# Patient Record
Sex: Female | Born: 1960
Health system: Southern US, Community
[De-identification: ages and names within clinical notes are randomized; demographics above are authoritative.]

## PROBLEM LIST (undated history)

## (undated) DIAGNOSIS — Z98811 Dental restoration status: Secondary | ICD-10-CM

## (undated) DIAGNOSIS — G5602 Carpal tunnel syndrome, left upper limb: Secondary | ICD-10-CM

## (undated) DIAGNOSIS — Q639 Congenital malformation of kidney, unspecified: Secondary | ICD-10-CM

## (undated) DIAGNOSIS — E039 Hypothyroidism, unspecified: Secondary | ICD-10-CM

## (undated) DIAGNOSIS — Z972 Presence of dental prosthetic device (complete) (partial): Secondary | ICD-10-CM

## (undated) DIAGNOSIS — M654 Radial styloid tenosynovitis [de Quervain]: Secondary | ICD-10-CM

## (undated) HISTORY — PX: KNEE ARTHROSCOPY: SHX127

---

## 1996-04-05 HISTORY — PX: PILONIDAL CYST / SINUS EXCISION: SUR543

## 2006-04-05 HISTORY — PX: WISDOM TOOTH EXTRACTION: SHX21

## 2009-07-04 ENCOUNTER — Encounter: Admission: RE | Admit: 2009-07-04 | Discharge: 2009-07-04 | Payer: Self-pay | Admitting: Gastroenterology

## 2009-07-14 ENCOUNTER — Encounter: Admission: RE | Admit: 2009-07-14 | Discharge: 2009-07-14 | Payer: Self-pay | Admitting: Gastroenterology

## 2009-07-21 ENCOUNTER — Encounter: Admission: RE | Admit: 2009-07-21 | Discharge: 2009-07-21 | Payer: Self-pay | Admitting: Gastroenterology

## 2009-08-27 ENCOUNTER — Encounter (HOSPITAL_COMMUNITY): Admission: RE | Admit: 2009-08-27 | Discharge: 2009-11-25 | Payer: Self-pay | Admitting: Internal Medicine

## 2009-09-09 ENCOUNTER — Encounter: Admission: RE | Admit: 2009-09-09 | Discharge: 2009-09-09 | Payer: Self-pay | Admitting: Internal Medicine

## 2009-09-09 ENCOUNTER — Other Ambulatory Visit: Admission: RE | Admit: 2009-09-09 | Discharge: 2009-09-09 | Payer: Self-pay | Admitting: Interventional Radiology

## 2009-10-24 ENCOUNTER — Encounter (HOSPITAL_COMMUNITY): Admission: RE | Admit: 2009-10-24 | Discharge: 2009-12-25 | Payer: Self-pay | Admitting: Internal Medicine

## 2010-01-20 ENCOUNTER — Encounter: Admission: RE | Admit: 2010-01-20 | Discharge: 2010-01-20 | Payer: Self-pay | Admitting: Gastroenterology

## 2010-04-25 ENCOUNTER — Other Ambulatory Visit: Payer: Self-pay | Admitting: Gastroenterology

## 2010-04-25 DIAGNOSIS — R911 Solitary pulmonary nodule: Secondary | ICD-10-CM

## 2010-11-15 ENCOUNTER — Emergency Department (HOSPITAL_COMMUNITY)
Admission: EM | Admit: 2010-11-15 | Discharge: 2010-11-16 | Disposition: A | Discharge status: Home / Self Care | Payer: Federal, State, Local not specified - PPO | Attending: Emergency Medicine | Admitting: Emergency Medicine

## 2010-11-15 DIAGNOSIS — R110 Nausea: Secondary | ICD-10-CM | POA: Insufficient documentation

## 2010-11-15 DIAGNOSIS — R109 Unspecified abdominal pain: Secondary | ICD-10-CM | POA: Insufficient documentation

## 2010-11-15 DIAGNOSIS — K7689 Other specified diseases of liver: Secondary | ICD-10-CM | POA: Insufficient documentation

## 2010-11-15 DIAGNOSIS — R319 Hematuria, unspecified: Secondary | ICD-10-CM | POA: Insufficient documentation

## 2010-11-15 DIAGNOSIS — D1809 Hemangioma of other sites: Secondary | ICD-10-CM | POA: Insufficient documentation

## 2010-11-15 DIAGNOSIS — E039 Hypothyroidism, unspecified: Secondary | ICD-10-CM | POA: Insufficient documentation

## 2010-11-15 DIAGNOSIS — R1013 Epigastric pain: Secondary | ICD-10-CM | POA: Insufficient documentation

## 2010-11-15 LAB — DIFFERENTIAL
Basophils Absolute: 0 K/uL (ref 0.0–0.1)
Basophils Relative: 0 % (ref 0–1)
Eosinophils Absolute: 0.1 10*3/uL (ref 0.0–0.7)
Eosinophils Relative: 2 % (ref 0–5)
Lymphocytes Relative: 40 % (ref 12–46)
Lymphs Abs: 2.8 K/uL (ref 0.7–4.0)
Monocytes Absolute: 0.4 10*3/uL (ref 0.1–1.0)
Monocytes Relative: 6 % (ref 3–12)
Neutro Abs: 3.7 K/uL (ref 1.7–7.7)
Neutrophils Relative %: 52 % (ref 43–77)

## 2010-11-15 LAB — COMPREHENSIVE METABOLIC PANEL WITH GFR
ALT: 13 U/L (ref 0–35)
AST: 17 U/L (ref 0–37)
Albumin: 3.4 g/dL — ABNORMAL LOW (ref 3.5–5.2)
BUN: 10 mg/dL (ref 6–23)
CO2: 26 meq/L (ref 19–32)
Creatinine, Ser: 0.81 mg/dL (ref 0.50–1.10)
GFR calc Af Amer: >60 mL/min (ref >60)
Glucose, Bld: 104 mg/dL — ABNORMAL HIGH (ref 70–99)
Potassium: 4.1 meq/L (ref 3.5–5.1)
Sodium: 140 meq/L (ref 135–145)
Total Bilirubin: 0.1 mg/dL — ABNORMAL LOW (ref 0.3–1.2)

## 2010-11-15 LAB — COMPREHENSIVE METABOLIC PANEL
Alkaline Phosphatase: 48 U/L (ref 39–117)
Calcium: 9.4 mg/dL (ref 8.4–10.5)
Chloride: 104 mEq/L (ref 96–112)
GFR calc non Af Amer: >60 mL/min (ref >60)
Total Protein: 7.6 g/dL (ref 6.0–8.3)

## 2010-11-15 LAB — CBC
HCT: 33.8 % — ABNORMAL LOW (ref 36.0–46.0)
Hemoglobin: 11.2 g/dL — ABNORMAL LOW (ref 12.0–15.0)
MCH: 28.6 pg (ref 26.0–34.0)
MCHC: 33.1 g/dL (ref 30.0–36.0)
MCV: 86.2 fL (ref 78.0–100.0)
Platelets: 311 K/uL (ref 150–400)
RBC: 3.92 MIL/uL (ref 3.87–5.11)
RDW: 14.3 % (ref 11.5–15.5)
WBC: 7 10*3/uL (ref 4.0–10.5)

## 2010-11-15 LAB — URINALYSIS, ROUTINE W REFLEX MICROSCOPIC
Bilirubin Urine: NEGATIVE
Glucose, UA: NEGATIVE mg/dL
Nitrite: NEGATIVE
Specific Gravity, Urine: 1.009 (ref 1.005–1.030)
Urobilinogen, UA: 0.2 mg/dL (ref 0.0–1.0)

## 2010-11-15 LAB — POCT PREGNANCY, URINE: Preg Test, Ur: NEGATIVE

## 2010-11-15 LAB — URINE MICROSCOPIC-ADD ON

## 2010-11-15 LAB — LIPASE, BLOOD: Lipase: 49 U/L (ref 11–59)

## 2010-11-16 ENCOUNTER — Emergency Department (HOSPITAL_COMMUNITY): Payer: Federal, State, Local not specified - PPO

## 2010-12-01 ENCOUNTER — Other Ambulatory Visit: Payer: Self-pay | Admitting: Gastroenterology

## 2011-01-22 ENCOUNTER — Ambulatory Visit
Admission: RE | Admit: 2011-01-22 | Discharge: 2011-01-22 | Disposition: A | Discharge status: Home / Self Care | Payer: Federal, State, Local not specified - PPO | Source: Ambulatory Visit | Attending: Gastroenterology | Admitting: Gastroenterology

## 2011-01-22 DIAGNOSIS — R911 Solitary pulmonary nodule: Secondary | ICD-10-CM

## 2011-02-11 ENCOUNTER — Other Ambulatory Visit: Payer: Self-pay | Admitting: Internal Medicine

## 2011-02-11 DIAGNOSIS — E042 Nontoxic multinodular goiter: Secondary | ICD-10-CM

## 2011-02-15 ENCOUNTER — Ambulatory Visit
Admission: RE | Admit: 2011-02-15 | Discharge: 2011-02-15 | Disposition: A | Discharge status: Home / Self Care | Payer: Federal, State, Local not specified - PPO | Source: Ambulatory Visit | Attending: Internal Medicine | Admitting: Internal Medicine

## 2011-02-15 DIAGNOSIS — E042 Nontoxic multinodular goiter: Secondary | ICD-10-CM

## 2011-05-07 ENCOUNTER — Emergency Department (HOSPITAL_COMMUNITY): Payer: Federal, State, Local not specified - PPO

## 2011-05-07 ENCOUNTER — Emergency Department (HOSPITAL_COMMUNITY)
Admission: EM | Admit: 2011-05-07 | Discharge: 2011-05-07 | Disposition: A | Discharge status: Home / Self Care | Payer: Federal, State, Local not specified - PPO | Attending: Emergency Medicine | Admitting: Emergency Medicine

## 2011-05-07 ENCOUNTER — Encounter (HOSPITAL_COMMUNITY): Payer: Self-pay | Admitting: Emergency Medicine

## 2011-05-07 ENCOUNTER — Other Ambulatory Visit: Payer: Self-pay

## 2011-05-07 DIAGNOSIS — J029 Acute pharyngitis, unspecified: Secondary | ICD-10-CM | POA: Insufficient documentation

## 2011-05-07 DIAGNOSIS — R1013 Epigastric pain: Secondary | ICD-10-CM

## 2011-05-07 DIAGNOSIS — IMO0001 Reserved for inherently not codable concepts without codable children: Secondary | ICD-10-CM | POA: Insufficient documentation

## 2011-05-07 DIAGNOSIS — J3489 Other specified disorders of nose and nasal sinuses: Secondary | ICD-10-CM | POA: Insufficient documentation

## 2011-05-07 LAB — COMPREHENSIVE METABOLIC PANEL
ALT: 10 U/L (ref 0–35)
Albumin: 3.4 g/dL — ABNORMAL LOW (ref 3.5–5.2)
CO2: 18 mEq/L — ABNORMAL LOW (ref 19–32)
Calcium: 9.4 mg/dL (ref 8.4–10.5)
Chloride: 102 mEq/L (ref 96–112)
GFR calc Af Amer: >90 mL/min (ref >90)
Glucose, Bld: 90 mg/dL (ref 70–99)
Total Protein: 7.9 g/dL (ref 6.0–8.3)

## 2011-05-07 LAB — DIFFERENTIAL
Basophils Relative: 0 % (ref 0–1)
Eosinophils Absolute: 0.2 10*3/uL (ref 0.0–0.7)
Monocytes Absolute: 0.6 10*3/uL (ref 0.1–1.0)
Monocytes Relative: 4 % (ref 3–12)
Neutro Abs: 10.6 10*3/uL — ABNORMAL HIGH (ref 1.7–7.7)

## 2011-05-07 LAB — CBC
HCT: 33.4 % — ABNORMAL LOW (ref 36.0–46.0)
MCV: 84.3 fL (ref 78.0–100.0)
Platelets: 287 10*3/uL (ref 150–400)
RBC: 3.96 MIL/uL (ref 3.87–5.11)
RDW: 13.8 % (ref 11.5–15.5)
WBC: 13.3 10*3/uL — ABNORMAL HIGH (ref 4.0–10.5)

## 2011-05-07 MED ORDER — ONDANSETRON HCL 4 MG PO TABS: 4.0000 mg | ORAL_TABLET | Freq: Four times a day (QID) | ORAL | Status: AC | PRN

## 2011-05-07 MED ORDER — HYDROMORPHONE HCL PF 1 MG/ML IJ SOLN
1.0000 mg | Freq: Once | INTRAMUSCULAR | Status: AC
  Administered 2011-05-07: 1 mg via INTRAVENOUS
  Filled 2011-05-07: qty 1

## 2011-05-07 MED ORDER — HYDROCODONE-ACETAMINOPHEN 5-325 MG PO TABS: 2.0000 | ORAL_TABLET | ORAL | Status: AC | PRN

## 2011-05-07 MED ORDER — ONDANSETRON HCL 4 MG/2ML IJ SOLN
INTRAMUSCULAR | Status: AC
  Administered 2011-05-07: 4 mg via INTRAVENOUS
  Filled 2011-05-07: qty 2

## 2011-05-07 MED ORDER — DIPHENHYDRAMINE HCL 50 MG/ML IJ SOLN
INTRAMUSCULAR | Status: AC
  Filled 2011-05-07: qty 1

## 2011-05-07 MED ORDER — ONDANSETRON HCL 4 MG/2ML IJ SOLN
4.0000 mg | Freq: Once | INTRAMUSCULAR | Status: AC
  Administered 2011-05-07: 4 mg via INTRAVENOUS
  Filled 2011-05-07: qty 2

## 2011-05-07 MED ORDER — HYDROMORPHONE HCL PF 1 MG/ML IJ SOLN
INTRAMUSCULAR | Status: AC
  Administered 2011-05-07: 1 mg via INTRAVENOUS
  Filled 2011-05-07: qty 1

## 2011-05-07 MED ORDER — DIPHENHYDRAMINE HCL 50 MG/ML IJ SOLN
12.5000 mg | Freq: Once | INTRAMUSCULAR | Status: AC
  Administered 2011-05-07: 12.5 mg via INTRAVENOUS

## 2011-05-07 NOTE — ED Provider Notes (Signed)
Medical screening examination/treatment/procedure(s) were conducted as a shared visit with non-physician practitioner(s) and myself.  I personally evaluated the patient during the encounter  Patient clearly in significant discomfort. Patient has severe epigastric tenderness but without guarding or rebound. We'll treat patient's pain before attempting further history gathering.   Hanley Seamen, MD 05/07/11 [phone]

## 2011-05-07 NOTE — ED Notes (Signed)
Patient much calmer states her pain is decreasing.

## 2011-05-07 NOTE — ED Notes (Signed)
PT. WOKE UP THIS MORNING WITH UPPER ABDOMINAL PAIN WITH NAUSEA , DENIES VOMITTING OR DIARRHEA. NO FEVER OR CHILLS.

## 2011-05-07 NOTE — ED Notes (Signed)
Family at bedside. 

## 2011-05-07 NOTE — ED Notes (Signed)
Patient transported to Ultrasound 

## 2011-05-07 NOTE — ED Notes (Addendum)
Patient presents to ed c/o severe epigastric pain onset this am states she was awaken by the pain. Unable to lay still husband at bedside. States her pain is 10 plus.

## 2011-05-07 NOTE — ED Notes (Signed)
Pt aware of need for urine specimen. New iv bag hung. Waiting on sample. Pt 's pain has improved.

## 2011-05-07 NOTE — ED Provider Notes (Addendum)
History     CSN: [id number]  Arrival date & time 05/07/11  0521   First MD Initiated Contact with Patient 05/07/11 [phone]      Chief Complaint  Patient presents with  . Abdominal Pain    (Consider location/radiation/quality/duration/timing/severity/associated sxs/prior treatment) HPI Comments: Patient presents emergency department with chief complaint of epigastric pain.  Patient states the pain began this morning around 4 or 5 a.m. and awoke her from sleep. This pain appears to be The pain does not radiate.  It is described as sharp epigastric pain and patient reports that she suffered from this pain before in the past.  Patient denies any arm or jaw pain, shortness of breath, dyspnea on exertion, edema, claudication, history of alcohol abuse, or gallstones.  Patient states that she had a endoscopy and colonoscopy this fall with Dr. Laural Benes who advised the patient to discontinue her PPI.  In addition patient states that she had flulike symptoms that began this week on Tuesday including body aches nasal congestion and sore throat.  Patient denies nausea, vomiting, diarrhea, hematemesis, hematochezia, melena.   PCP Dr. Derrel Nip  Patient is a 51 y.o. female presenting with abdominal pain. The history is provided by the patient.  Abdominal Pain The primary symptoms of the illness include abdominal pain.    History reviewed. No pertinent past medical history.  History reviewed. No pertinent past surgical history.  No family history on file.  History  Substance Use Topics  . Smoking status: Never Smoker   . Smokeless tobacco: Not on file  . Alcohol Use: No    OB History    Grav Para Term Preterm Abortions TAB SAB Ect Mult Living                  Review of Systems  Gastrointestinal: Positive for abdominal pain.    Allergies  Iohexol  Home Medications   Current Outpatient Rx  Name Route Sig Dispense Refill  . CETIRIZINE HCL 10 MG PO TABS Oral Take 10 mg by mouth daily.      . DROSPIRENONE-ETHINYL ESTRADIOL 3-0.03 MG PO TABS Oral Take 1 tablet by mouth daily.    Marland Kitchen HYOSCYAMINE SULFATE 0.125 MG PO TABS Oral Take 0.125 mg by mouth every 4 (four) hours as needed. For stomach spasms    . LEVOTHYROXINE SODIUM 125 MCG PO TABS Oral Take 125 mcg by mouth daily.      BP 102/66  Pulse 65  Temp(Src) 97.6 F (36.4 C) (Oral)  Resp 16  SpO2 99%  LMP 04/29/2011  Physical Exam  Nursing note and vitals reviewed. Constitutional: She appears well-developed and well-nourished. No distress.  HENT:  Head: Normocephalic and atraumatic.  Eyes: Conjunctivae and EOM are normal. Pupils are equal, round, and reactive to light.  Neck: Normal range of motion. Neck supple. Normal carotid pulses and no JVD present. Carotid bruit is not present. No rigidity. Normal range of motion present.  Cardiovascular: Normal rate, regular rhythm, S1 normal, S2 normal, normal heart sounds, intact distal pulses and normal pulses.  Exam reveals no gallop and no friction rub.   No murmur heard.      No pitting edema bilaterally, RRR, no aberrant sounds on auscultations, distal pulses intact, no carotid bruit or JVD.   Pulmonary/Chest: Effort normal and breath sounds normal. No accessory muscle usage or stridor. No respiratory distress. She exhibits no tenderness and no bony tenderness.  Abdominal: Soft. Bowel sounds are normal. There is tenderness in the epigastric area.  Epigastric tenderness.  Non pulsatile aorta.   Skin: Skin is warm, dry and intact. No rash noted. She is not diaphoretic. No cyanosis. Nails show no clubbing.    ED Course  Procedures (including critical care time)  Labs Reviewed  COMPREHENSIVE METABOLIC PANEL - Abnormal; Notable for the following:    CO2 18 (*)    Albumin 3.4 (*)    Total Bilirubin 0.2 (*)    All other components within normal limits  CBC - Abnormal; Notable for the following:    WBC 13.3 (*)    Hemoglobin 11.4 (*)    HCT 33.4 (*)    All other  components within normal limits  DIFFERENTIAL - Abnormal; Notable for the following:    Neutrophils Relative 80 (*)    Neutro Abs 10.6 (*)    All other components within normal limits  LIPASE, BLOOD  URINALYSIS, ROUTINE W REFLEX MICROSCOPIC   US Abdomen Complete  05/07/2011  *RADIOLOGY REPORT*  Clinical Data:  Epigastric abdominal pain  ABDOMINAL ULTRASOUND COMPLETE  Comparison:  11/16/2010  Findings:  Gallbladder:  No gallstones, gallbladder wall thickening, or pericholecystic fluid.  Common Bile Duct:  Within normal limits in caliber.  Liver: Echogenic round masses compatible with previously diagnosed hemangioma as are again noted; largest in the dome of the right hepatic lobe measures 1.7 x 1.5 x 0.9 cm.  No intrahepatic ductal dilatation.  Cysts are also again noted, largest in the right hepatic lobe measuring 1.4 x 1.4 x 1.1 cm.  IVC:  Appears normal.  Pancreas:  Obscured by bowel gas.  Spleen:  Within normal limits in size and echotexture.  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  No hydronephrosis.  Left kidney:  Crossed fused renal ectopia again noted.  Abdominal Aorta:  No aneurysm identified.  IMPRESSION: No acute abnormality or significant change.  Again, no gallstone is identified.  Original Report Authenticated By: Harrel Lemon, M.D.     No diagnosis found.   Date: 05/07/2011  Rate: 62  Rhythm: normal sinus rhythm  QRS Axis: normal  Intervals: normal  ST/T Wave abnormalities: normal  Conduction Disutrbances: none  Narrative Interpretation:   Old EKG Reviewed: No old EKG   Patient's abdominal pain has been managed in the emergency department with Dilaudid.  Patient is currently comfortable and in no acute distress.   MDM  Chronic abdominal pain The patient's epigastric pain is not likely cardiac in nature due to normal EKG and negative troponin.  There does not appear to be a pancreatic  or biliary causes for pain either.  Patient has been advised to followup with her primary care physician as well as her gastroenterologist if symptoms persist for further workup.  Patient will be sent home with a short course of pain medication.Pt is agreeable with plan to f-u with her GI doctor. Care adn plan discussed w Dr. Brooke Dare who agrees.        Jaci Carrel, PA-C 05/07/11 [address], PA-C 06/02/11 1757

## 2011-05-11 ENCOUNTER — Other Ambulatory Visit (HOSPITAL_COMMUNITY): Payer: Self-pay | Admitting: Gastroenterology

## 2011-05-11 DIAGNOSIS — K8020 Calculus of gallbladder without cholecystitis without obstruction: Secondary | ICD-10-CM

## 2011-05-17 ENCOUNTER — Other Ambulatory Visit (HOSPITAL_COMMUNITY): Payer: Self-pay | Admitting: Gastroenterology

## 2011-05-19 ENCOUNTER — Encounter (HOSPITAL_COMMUNITY)
Admission: RE | Admit: 2011-05-19 | Discharge: 2011-05-19 | Disposition: A | Discharge status: Home / Self Care | Payer: Federal, State, Local not specified - PPO | Source: Ambulatory Visit | Attending: Gastroenterology | Admitting: Gastroenterology

## 2011-05-19 DIAGNOSIS — K8020 Calculus of gallbladder without cholecystitis without obstruction: Secondary | ICD-10-CM | POA: Insufficient documentation

## 2011-05-19 MED ORDER — TECHNETIUM TC 99M MEBROFENIN IV KIT
5.0000 | PACK | Freq: Once | INTRAVENOUS | Status: AC | PRN
  Administered 2011-05-19: 5 via INTRAVENOUS

## 2011-05-19 MED ORDER — SINCALIDE 5 MCG IJ SOLR
INTRAMUSCULAR | Status: AC
  Administered 2011-05-19: 5 ug
  Filled 2011-05-19: qty 5

## 2011-06-04 NOTE — ED Provider Notes (Signed)
Medical screening examination/treatment/procedure(s) were performed by non-physician practitioner and as supervising physician I was immediately available for consultation/collaboration.   Shailey Butterbaugh, MD 06/04/11 1501 

## 2011-07-12 ENCOUNTER — Ambulatory Visit (INDEPENDENT_AMBULATORY_CARE_PROVIDER_SITE_OTHER): Payer: Federal, State, Local not specified - PPO | Admitting: Obstetrics and Gynecology

## 2011-07-12 ENCOUNTER — Encounter (INDEPENDENT_AMBULATORY_CARE_PROVIDER_SITE_OTHER): Payer: Federal, State, Local not specified - PPO

## 2011-07-12 DIAGNOSIS — D259 Leiomyoma of uterus, unspecified: Secondary | ICD-10-CM

## 2011-07-12 DIAGNOSIS — Z01419 Encounter for gynecological examination (general) (routine) without abnormal findings: Secondary | ICD-10-CM

## 2011-07-16 ENCOUNTER — Telehealth: Payer: Self-pay | Admitting: Obstetrics and Gynecology

## 2011-07-20 NOTE — Telephone Encounter (Signed)
Patient wants consultation with Dr. Estanislado Pandy for robotic hysterectomy due to menorrhagia.

## 2011-08-05 ENCOUNTER — Telehealth: Payer: Self-pay | Admitting: Obstetrics and Gynecology

## 2011-08-11 ENCOUNTER — Encounter: Payer: Self-pay | Admitting: Obstetrics and Gynecology

## 2011-08-11 ENCOUNTER — Ambulatory Visit (INDEPENDENT_AMBULATORY_CARE_PROVIDER_SITE_OTHER): Payer: Federal, State, Local not specified - PPO | Admitting: Obstetrics and Gynecology

## 2011-08-11 VITALS — BP 100/72 | Ht 64.0 in | Wt 174.0 lb

## 2011-08-11 DIAGNOSIS — D259 Leiomyoma of uterus, unspecified: Secondary | ICD-10-CM

## 2011-08-11 NOTE — Patient Instructions (Signed)
Patient Education Materials to be provided at check out (*indicates is located in accordion folder):  *Da Vinci Hysterectomy  

## 2011-08-12 ENCOUNTER — Encounter: Payer: Self-pay | Admitting: Obstetrics and Gynecology

## 2011-08-12 NOTE — Progress Notes (Signed)
This is a 51 year old married African American female gravida 1 para 0, we centimeters by Henreitta Leber PA-C. to evaluate if the patient is a candidate for robotically assisted total hysterectomy. At her last annual exam on 07/12/2011 patient was complaining of worsening menorrhagia for which an ultrasound was performed. The ultrasound revealed a uterus measuring 8.2 x 7.9 x 3.5 cm with at least 4 measurable fibroids. A dominant posterior fibroid measures 8 cm, a posterior subserosal fibroid measures 4.3 cm, a fundal intramural fibroid measures 2 cm and an interior intramural fibroid measures 3 cm. The patient is interested in definitive treatment in the form of robotically-assisted hysterectomy.  Her medical history is remarkable for hypothyroidism, no previous abdominal surgery and history of duplicate left kidney.  Robotically-assisted hysterectomy was reviewed with the patient.   Benefits of the robotic approach include lesser postoperative pain, less blood loss during surgery, reduced risk of injury to other organs due to better visualization with a 3-D HD 10 times magnifying camera, shorter hospital stay between 0-1 night and rapid recovery with return to daily routine in 2-3 weeks. Although robotically-assisted hysterectomy has a longer operative time than traditional laparotomy, in a patient with good medical history, the benefits usually outweigh the risks.    risks still include bleeding, infection, injury to other organs, need for laparotomy, transient post operative facial edema, increasedrisk of pelvic prolapse associated with any hysterectomy as well as earlier  onset of menopause. Preservation or preventative removal of the ovaries was also reviewed and left to the patient's discretion. Finally, the option of supracervical hysterectomy was also discussed with the possible but yet unconfirmed benefits of reduction of pelvic prolapse. If supracervical hysterectomy is performed, Pap smear  screening would continue as currently recommended and a small but possible risk of cervical fibroid development is present.  All questions were answered. The patient desires to proceed.  Will have radiologist review latest CT-scan 07/2009 to clarify renal / ureteral anatomy. Surgery and preoperative evaluation will be scheduled.  30 minute face-to-face time was spent with the patient in counseling.

## 2011-08-13 ENCOUNTER — Other Ambulatory Visit: Payer: Self-pay

## 2011-08-13 DIAGNOSIS — R102 Pelvic and perineal pain: Secondary | ICD-10-CM

## 2011-08-13 MED ORDER — PREDNISONE 50 MG PO TABS: ORAL_TABLET | ORAL | Status: DC

## 2011-08-13 NOTE — Telephone Encounter (Signed)
Pt sch for CT on 08-23-11 at [facility].  Pt advised of all information including 13 pre-procedure prep.  Pt was aware of this from previous CT.  Will call in Prednisone 50 mg #3 no RF ok per SR.

## 2011-08-16 ENCOUNTER — Ambulatory Visit (HOSPITAL_COMMUNITY): Admission: RE | Admit: 2011-08-16 | Payer: Federal, State, Local not specified - PPO | Source: Ambulatory Visit

## 2011-08-17 ENCOUNTER — Other Ambulatory Visit: Payer: Self-pay | Admitting: Obstetrics and Gynecology

## 2011-08-23 ENCOUNTER — Ambulatory Visit (HOSPITAL_COMMUNITY)
Admission: RE | Admit: 2011-08-23 | Discharge: 2011-08-23 | Disposition: A | Discharge status: Home / Self Care | Payer: Federal, State, Local not specified - PPO | Source: Ambulatory Visit | Attending: Obstetrics and Gynecology | Admitting: Obstetrics and Gynecology

## 2011-08-23 DIAGNOSIS — D259 Leiomyoma of uterus, unspecified: Secondary | ICD-10-CM | POA: Insufficient documentation

## 2011-08-23 DIAGNOSIS — Q638 Other specified congenital malformations of kidney: Secondary | ICD-10-CM | POA: Insufficient documentation

## 2011-08-23 DIAGNOSIS — R911 Solitary pulmonary nodule: Secondary | ICD-10-CM | POA: Insufficient documentation

## 2011-08-23 DIAGNOSIS — R102 Pelvic and perineal pain: Secondary | ICD-10-CM

## 2011-08-23 DIAGNOSIS — D1809 Hemangioma of other sites: Secondary | ICD-10-CM | POA: Insufficient documentation

## 2011-08-23 MED ORDER — IOHEXOL 300 MG/ML  SOLN
100.0000 mL | Freq: Once | INTRAMUSCULAR | Status: AC | PRN
  Administered 2011-08-23: 100 mL via INTRAVENOUS

## 2011-08-24 ENCOUNTER — Telehealth: Payer: Self-pay | Admitting: Obstetrics and Gynecology

## 2011-08-24 NOTE — Telephone Encounter (Signed)
Robotic Hysterectomy scheduled for 10/19/11 @ 12:00 with SR/EP. [employer] EFFECTIVE 04/09/07.  PLAN PAYS 85/15 AFTER A $350 DEDUCTIBLE, PRE-OP DUE $224.77 -Adrianne Pridgen

## 2011-09-01 ENCOUNTER — Telehealth: Payer: Self-pay

## 2011-09-01 NOTE — Telephone Encounter (Signed)
Spoke with surg sch at [facility].  (chasity)  She stated that Dr Isabel Caprice is Ms Margaret Rud MD and that he will not be in the office the date of the surg,  July 16th.  She suggested calling him and discussing further.  That phone # is [phone].  He will be in next Tues 09-07-11 and Fri 09-10-11.

## 2011-09-27 ENCOUNTER — Encounter (HOSPITAL_COMMUNITY): Payer: Self-pay | Admitting: Pharmacy Technician

## 2011-09-29 ENCOUNTER — Other Ambulatory Visit: Payer: Self-pay | Admitting: Obstetrics and Gynecology

## 2011-09-30 ENCOUNTER — Other Ambulatory Visit: Payer: Self-pay | Admitting: Obstetrics and Gynecology

## 2011-10-12 ENCOUNTER — Encounter: Payer: Self-pay | Admitting: Obstetrics and Gynecology

## 2011-10-12 ENCOUNTER — Ambulatory Visit (INDEPENDENT_AMBULATORY_CARE_PROVIDER_SITE_OTHER): Payer: Federal, State, Local not specified - PPO | Admitting: Obstetrics and Gynecology

## 2011-10-12 ENCOUNTER — Encounter: Payer: Federal, State, Local not specified - PPO | Admitting: Obstetrics and Gynecology

## 2011-10-12 ENCOUNTER — Encounter (HOSPITAL_COMMUNITY): Payer: Self-pay

## 2011-10-12 ENCOUNTER — Encounter (HOSPITAL_COMMUNITY)
Admission: RE | Admit: 2011-10-12 | Discharge: 2011-10-12 | Disposition: A | Discharge status: Home / Self Care | Payer: Federal, State, Local not specified - PPO | Source: Ambulatory Visit | Attending: Obstetrics and Gynecology | Admitting: Obstetrics and Gynecology

## 2011-10-12 VITALS — BP 102/70 | HR 68 | Temp 98.1°F | Resp 16 | Ht 63.75 in | Wt 173.0 lb

## 2011-10-12 DIAGNOSIS — E079 Disorder of thyroid, unspecified: Secondary | ICD-10-CM

## 2011-10-12 DIAGNOSIS — K589 Irritable bowel syndrome without diarrhea: Secondary | ICD-10-CM

## 2011-10-12 DIAGNOSIS — D219 Benign neoplasm of connective and other soft tissue, unspecified: Secondary | ICD-10-CM | POA: Insufficient documentation

## 2011-10-12 DIAGNOSIS — D259 Leiomyoma of uterus, unspecified: Secondary | ICD-10-CM

## 2011-10-12 DIAGNOSIS — B977 Papillomavirus as the cause of diseases classified elsewhere: Secondary | ICD-10-CM

## 2011-10-12 DIAGNOSIS — A630 Anogenital (venereal) warts: Secondary | ICD-10-CM | POA: Insufficient documentation

## 2011-10-12 DIAGNOSIS — Z01818 Encounter for other preprocedural examination: Secondary | ICD-10-CM

## 2011-10-12 HISTORY — DX: Hypothyroidism, unspecified: E03.9

## 2011-10-12 LAB — DIFFERENTIAL
Basophils Absolute: 0 10*3/uL (ref 0.0–0.1)
Basophils Relative: 0 % (ref 0–1)
Eosinophils Absolute: 0.1 10*3/uL (ref 0.0–0.7)
Eosinophils Relative: 1 % (ref 0–5)
Lymphocytes Relative: 33 % (ref 12–46)
Lymphs Abs: 2.3 10*3/uL (ref 0.7–4.0)
Monocytes Absolute: 0.4 10*3/uL (ref 0.1–1.0)
Monocytes Relative: 6 % (ref 3–12)
Neutro Abs: 4.2 10*3/uL (ref 1.7–7.7)
Neutrophils Relative %: 60 % (ref 43–77)

## 2011-10-12 LAB — CBC
HCT: 35.6 % — ABNORMAL LOW (ref 36.0–46.0)
MCV: 86.2 fL (ref 78.0–100.0)
RDW: 14.5 % (ref 11.5–15.5)
WBC: 7 10*3/uL (ref 4.0–10.5)

## 2011-10-12 LAB — BASIC METABOLIC PANEL
BUN: 7 mg/dL (ref 6–23)
CO2: 25 mEq/L (ref 19–32)
Chloride: 99 mEq/L (ref 96–112)
Creatinine, Ser: 0.67 mg/dL (ref 0.50–1.10)
Glucose, Bld: 87 mg/dL (ref 70–99)

## 2011-10-12 LAB — SURGICAL PCR SCREEN: Staphylococcus aureus: NEGATIVE

## 2011-10-12 NOTE — Pre-Procedure Instructions (Signed)
Patient had abnormal EKG where Dr Malen Gauze wanted her EKG evaluated and medically cleared for 10/19/11 surgery.  I made an appt with her MD - Dr Renford Dills at [place] [phone] for Wednesday, 10/13/11 at 915am.  Copy of EKG with our fax number given to patient to take to her appt.  Adrianne at Dr Cloretta Ned office informed.

## 2011-10-12 NOTE — Patient Instructions (Addendum)
   Your procedure is scheduled on:  Tuesday, July 16th  Enter through the Main Entrance of [facility] at: 6:00am Pick up the phone at the desk and dial [phone] and inform us of your arrival.  Please call this number if you have any problems the morning of surgery: [phone]  Remember: Do not eat food after midnight: Monday Do not drink clear liquids after: Monday Take these medicines the morning of surgery with a SIP OF WATER: zyrtec, synthoid  Do not wear jewelry, make-up, or FINGER nail polish No metal in your hair or on your body. Do not wear lotions, powders, perfumes or deodorant. Do not shave 48 hours prior to surgery. Do not bring valuables to the hospital. Contacts, dentures or bridgework may not be worn into surgery.  Leave suitcase in the car. After Surgery it may be brought to your room. For patients being admitted to the hospital, checkout time is 11:00am the day of discharge. Home with husband Chrissie Noa  cell [phone]  Patients discharged on the day of surgery will not be allowed to drive home.     Remember to use your hibiclens as instructed.Please shower with 1/2 bottle the evening before your surgery and the other 1/2 bottle the morning of surgery. Neck down avoiding private area.

## 2011-10-12 NOTE — Progress Notes (Addendum)
Margaret Hughes is a 51 y.o. married black female G1P0010 who presents for a robot assisted total laparoscopic hysterectomy for symptomatic uterine fibroids.  Patient has a longstanding history, of symptomatic uterine fibroids,  that has not been responsive to oral contraceptives.  One week prior to her menses, the patient experiences cramping, rated at 8.5/10,  that continues during her three day period.  Patient's flow requires her to change a pad twice a day and she refrains from taking more than a half of a Tylenol for pain because it "knocks her out".  She admits to pelvic pressure and dyspareunia however, denies vaginitis symptoms, urinary urgency, incontinence or intermenstrual bleeding.   Pelvic ultrasound, 07/12/11 showed, uterus (retroverted)  8.22 x 7.91 x 3.49 cm, with #4 measurable fibroids: posterior-7.9 x 6.6 x 7.4 cm;  posterior/subserosal-3.4 x 4.3 x 3.4 cm; fundal/intramural- 1.7 x 1.5 x 1.8 cm  and anterior intramural-2.8 x 2.4 x 2.7 cm; left ovary 1.73 x 1.66 x 1.48 cm and right ovary 2.38 x 1.09 x 1.12 cm.  A review of both medical and surgical management options for her symptoms, to include observation.  After careful consideration, patient has decided to proceed with definitive therapy in the form of hysterectomy.  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  and abnormal PAP smears but for the last five years they have been normal;  Last PAP smear 2013  Medical History: thyroid disease, ectopic kidneys, irritable bowel syndrome, vertigo, asthma, migraines  Surgical History: 1990 & 2007  Right Knee Surgery; 1998 Pilonidal Cyst Excision Denies problems with anesthesia or history of blood transfusions, however, she reports that she is super sensitive medications in general and pain medication in particular (typically needs much less than  standard dosages)  Family History: Cancer, thyroid disease, diabetes, hypertension  Social History:  Married, Network engineer; 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 cetirizine (ZYRTEC) 10 MG tablet Take 10 mg by mouth daily.      . drospirenone-ethinyl estradiol (SYEDA) 3-0.03 MG tablet Take 1 tablet by mouth daily.      . fluticasone (FLONASE) 50 MCG/ACT nasal spray Place 2 sprays into the nose daily.      Marland Kitchen HYDROcodone-acetaminophen (NORCO) 5-325 MG per tablet Take 1 tablet by mouth every 6 (six) hours as needed. For stomach pain      . hydrOXYzine (ATARAX/VISTARIL) 10 MG tablet Take 10 mg by mouth 3 (three) times daily as needed. For itching      . levothyroxine (SYNTHROID, LEVOTHROID) 125 MCG tablet Take 125 mcg by mouth daily.      . Multiple Vitamin (MULTIVITAMIN) tablet Take 1 tablet by mouth daily.      . ondansetron (ZOFRAN) 4 MG tablet Take 4 mg by mouth every 8 (eight) hours as needed. For nausea      . predniSONE (DELTASONE) 50 MG tablet Pt to take 1 pill at 13 hrs prior, 7 hours prior and 1 hour prior to procedure. Pt to also take 1 Benadryl 50 mg at same time as last prednisone.  3 tablet  0    Allergies  Allergen Reactions  . Iodine Swelling  . Iohexol      Code: SNEEZE, Desc: facial swelling after iodine injection, pt ok w/  13 hr prep//a.c., Onset Date: [id number]   . Tape Hives    Plastic tape   Denies soy, latex, shellfish, or peanut sensitivity  ROS: Admits to occasional right pain Denies headache, vision changes, dysphagia, tinnitus, dizziness,  chest pain, shortness of breath, nausea, vomiting, diarrhea, dysuria, hematuria, pelvic pain, swelling of joints,easy bruising,  myalgias, arthralgias, skin rashes and except as is mentioned in the history of present illness, patient's review of systems is otherwise negative  Physical Exam    BP 102/70  Pulse  68  Temp 98.1 F (36.7 C) (Oral)  Resp 16  Ht 5' 3.75" (1.619 m)  Wt 173 lb (78.472 kg)  BMI 29.93 kg/m2  LMP 09/15/2011  Neck: supple without masses or thyromegaly Lungs: clear to auscultation Heart: regular rate and rhythm Abdomen: soft, non-tender and no organomegaly Pelvic:EGBUS- wnl; vagina-normal rugae; uterus-retroverted, irregular, 10-12 weeks,  small cervix;  adnexae-no tenderness or masses Extremities:  no clubbing, cyanosis or edema  Assesment: Symptomatic Uterine Fibroids                      Ectopic Kidney   Disposition: The patient was given the indication for her procedure(s) along with the risks, which include but are not limited to, reaction to anesthesia, damage to adjacent organs, infection, excessive bleeding, formation of scar tissue, early menopause, pelvic prolapse and the possible need for an open abdominal incision. She was further advised that she will experience transient post operative facial edema, that her hospital stay is expected to be 0-2 days, she should be able to return to her usual activities within 2-3 weeks (except intercourse to be delayed until 6 weeks) and that the robotic approach to her surgery requires more time to perform than an open abdominal approach.  Patient was given the Miralax bowel prep to be completed 24 hours prior to procedure.  She verbalized understanding of these risks and pre-operative instructions and has consented to proceed with a Robot assisted laparoscopic hysterectomy at [facility] [place] on October 19, 2011 at 7:30 a.m., preceded by the placement of ureteral stents that will be removed immediately following procedure.  CSN# [id number]   Shelbe Haglund J. Lowell Guitar, PA-C  for Dr. Estanislado Pandy

## 2011-10-13 NOTE — H&P (Signed)
Margaret Hughes is a 51 y.o. married black female G1P0010 who presents for a robot assisted total laparoscopic hysterectomy for symptomatic uterine fibroids.  Patient has a longstanding history, of symptomatic uterine fibroids,  that has not been responsive to oral contraceptives.  One week prior to her menses, the patient experiences cramping, rated at 8.5/10,  that continues during her three day period.  Patient's flow requires her to change a pad twice a day and she refrains from taking more than a half of a Tylenol for pain because it "knocks her out".  She admits to pelvic pressure and dyspareunia however, denies vaginitis symptoms, urinary urgency, incontinence or intermenstrual bleeding.   Pelvic ultrasound, 07/12/11 showed, uterus (retroverted)  8.22 x 7.91 x 3.49 cm, with #4 measurable fibroids: posterior-7.9 x 6.6 x 7.4 cm;  posterior/subserosal-3.4 x 4.3 x 3.4 cm; fundal/intramural- 1.7 x 1.5 x 1.8 cm  and anterior intramural-2.8 x 2.4 x 2.7 cm; left ovary 1.73 x 1.66 x 1.48 cm and right ovary 2.38 x 1.09 x 1.12 cm.  A review of both medical and surgical management options for her symptoms, to include observation.  After careful consideration, patient has decided to proceed with definitive therapy in the form of hysterectomy.  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  and abnormal PAP smears but for the last five years they have been normal;  Last PAP smear 2013  Medical History: thyroid disease, ectopic kidneys, irritable bowel syndrome, vertigo, asthma, migraines  Surgical History: 1990 & 2007  Right Knee Surgery; 1998 Pilonidal Cyst Excision Denies problems with anesthesia or history of blood transfusions, however, she reports that she is super sensitive medications in general and pain medication in particular (typically needs much less than  standard dosages)  Family History: Cancer, thyroid disease, diabetes, hypertension  Social History:  Married, Network engineer; 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 cetirizine (ZYRTEC) 10 MG tablet Take 10 mg by mouth daily.      . drospirenone-ethinyl estradiol (SYEDA) 3-0.03 MG tablet Take 1 tablet by mouth daily.      . fluticasone (FLONASE) 50 MCG/ACT nasal spray Place 2 sprays into the nose daily.      Marland Kitchen HYDROcodone-acetaminophen (NORCO) 5-325 MG per tablet Take 1 tablet by mouth every 6 (six) hours as needed. For stomach pain      . hydrOXYzine (ATARAX/VISTARIL) 10 MG tablet Take 10 mg by mouth 3 (three) times daily as needed. For itching      . levothyroxine (SYNTHROID, LEVOTHROID) 125 MCG tablet Take 125 mcg by mouth daily.      . Multiple Vitamin (MULTIVITAMIN) tablet Take 1 tablet by mouth daily.      . ondansetron (ZOFRAN) 4 MG tablet Take 4 mg by mouth every 8 (eight) hours as needed. For nausea      . predniSONE (DELTASONE) 50 MG tablet Pt to take 1 pill at 13 hrs prior, 7 hours prior and 1 hour prior to procedure. Pt to also take 1 Benadryl 50 mg at same time as last prednisone.  3 tablet  0    Allergies  Allergen Reactions  . Iodine Swelling  . Iohexol      Code: SNEEZE, Desc: facial swelling after iodine injection, pt ok w/  13 hr prep//a.c., Onset Date: [id number]   . Tape Hives    Plastic tape   Denies soy, latex, shellfish, or peanut sensitivity  ROS: Admits to occasional right pain Denies headache, vision changes, dysphagia, tinnitus, dizziness,  chest pain, shortness of breath, nausea, vomiting, diarrhea, dysuria, hematuria, pelvic pain, swelling of joints,easy bruising,  myalgias, arthralgias, skin rashes and except as is mentioned in the history of present illness, patient's review of systems is otherwise negative  Physical Exam    BP 102/70  Pulse  68  Temp 98.1 F (36.7 C) (Oral)  Resp 16  Ht 5' 3.75" (1.619 m)  Wt 173 lb (78.472 kg)  BMI 29.93 kg/m2  LMP 09/15/2011  Neck: supple without masses or thyromegaly Lungs: clear to auscultation Heart: regular rate and rhythm Abdomen: soft, non-tender and no organomegaly Pelvic:EGBUS- wnl; vagina-normal rugae; uterus-retroverted, irregular, 10-12 weeks,  small cervix;  adnexae-no tenderness or masses Extremities:  no clubbing, cyanosis or edema  Assesment: Symptomatic Uterine Fibroids                      Ectopic Kidney   Disposition: The patient was given the indication for her procedure(s) along with the risks, which include but are not limited to, reaction to anesthesia, damage to adjacent organs, infection, excessive bleeding, formation of scar tissue, early menopause, pelvic prolapse and the possible need for an open abdominal incision. She was further advised that she will experience transient post operative facial edema, that her hospital stay is expected to be 0-2 days, she should be able to return to her usual activities within 2-3 weeks (except intercourse to be delayed until 6 weeks) and that the robotic approach to her surgery requires more time to perform than an open abdominal approach.  Patient was given the Miralax bowel prep to be completed 24 hours prior to procedure.  She verbalized understanding of these risks and pre-operative instructions and has consented to proceed with a robot assisted laparoscopic hysterectomy at [facility] on October 19, 2011 at 7:30 a.m. preceded by the placement of ureteral stents that will be removed post procedure.  CSN# [id number]   Kassiah Mccrory J. Lowell Guitar, PA-C  for Dr. Estanislado Pandy

## 2011-10-14 NOTE — Pre-Procedure Instructions (Signed)
Medical clearance rec'd from Dr Nehemiah Settle, reviewed and accepted by Dr Malen Gauze.

## 2011-10-18 MED ORDER — DEXTROSE 5 % IV SOLN
2.0000 g | INTRAVENOUS | Status: AC
  Administered 2011-10-19: 2 g via INTRAVENOUS
  Filled 2011-10-18: qty 2

## 2011-10-18 MED ORDER — BELLADONNA ALKALOIDS-OPIUM 16.2-60 MG RE SUPP
1.0000 | Freq: Once | RECTAL | Status: AC
  Administered 2011-10-19: 1 via RECTAL
  Filled 2011-10-18: qty 1

## 2011-10-19 ENCOUNTER — Encounter (HOSPITAL_COMMUNITY): Payer: Self-pay | Admitting: Anesthesiology

## 2011-10-19 ENCOUNTER — Ambulatory Visit (HOSPITAL_COMMUNITY)
Admission: RE | Admit: 2011-10-19 | Discharge: 2011-10-19 | Disposition: A | Discharge status: Home / Self Care | Payer: Federal, State, Local not specified - PPO | Source: Ambulatory Visit | Attending: Obstetrics and Gynecology | Admitting: Obstetrics and Gynecology

## 2011-10-19 ENCOUNTER — Ambulatory Visit (HOSPITAL_COMMUNITY): Payer: Federal, State, Local not specified - PPO | Admitting: Anesthesiology

## 2011-10-19 ENCOUNTER — Ambulatory Visit (HOSPITAL_COMMUNITY): Payer: Federal, State, Local not specified - PPO

## 2011-10-19 ENCOUNTER — Encounter (HOSPITAL_COMMUNITY)
Admission: RE | Disposition: A | Discharge status: Home / Self Care | Payer: Self-pay | Source: Ambulatory Visit | Attending: Obstetrics and Gynecology

## 2011-10-19 ENCOUNTER — Other Ambulatory Visit: Payer: Self-pay | Admitting: Obstetrics and Gynecology

## 2011-10-19 ENCOUNTER — Encounter (HOSPITAL_COMMUNITY): Payer: Self-pay

## 2011-10-19 DIAGNOSIS — D251 Intramural leiomyoma of uterus: Secondary | ICD-10-CM | POA: Insufficient documentation

## 2011-10-19 DIAGNOSIS — D259 Leiomyoma of uterus, unspecified: Secondary | ICD-10-CM

## 2011-10-19 DIAGNOSIS — Q638 Other specified congenital malformations of kidney: Secondary | ICD-10-CM | POA: Insufficient documentation

## 2011-10-19 DIAGNOSIS — N949 Unspecified condition associated with female genital organs and menstrual cycle: Secondary | ICD-10-CM | POA: Insufficient documentation

## 2011-10-19 DIAGNOSIS — D252 Subserosal leiomyoma of uterus: Secondary | ICD-10-CM | POA: Insufficient documentation

## 2011-10-19 DIAGNOSIS — Z01818 Encounter for other preprocedural examination: Secondary | ICD-10-CM | POA: Insufficient documentation

## 2011-10-19 DIAGNOSIS — Z01812 Encounter for preprocedural laboratory examination: Secondary | ICD-10-CM | POA: Insufficient documentation

## 2011-10-19 DIAGNOSIS — IMO0002 Reserved for concepts with insufficient information to code with codable children: Secondary | ICD-10-CM | POA: Insufficient documentation

## 2011-10-19 HISTORY — PX: CYSTOSCOPY WITH URETEROSCOPY AND STENT PLACEMENT: SHX6377

## 2011-10-19 HISTORY — PX: ROBOTIC ASSISTED TOTAL HYSTERECTOMY: SHX6085

## 2011-10-19 SURGERY — ROBOTIC ASSISTED TOTAL HYSTERECTOMY
Anesthesia: General | Site: Urethra | Wound class: Clean Contaminated

## 2011-10-19 MED ORDER — ROCURONIUM BROMIDE 100 MG/10ML IV SOLN
INTRAVENOUS | Status: DC | PRN
  Administered 2011-10-19: 45 mg via INTRAVENOUS
  Administered 2011-10-19 (×2): 10 mg via INTRAVENOUS
  Administered 2011-10-19: 5 mg via INTRAVENOUS
  Administered 2011-10-19: 10 mg via INTRAVENOUS

## 2011-10-19 MED ORDER — IBUPROFEN 600 MG PO TABS: ORAL_TABLET | ORAL | Status: DC

## 2011-10-19 MED ORDER — KETOROLAC TROMETHAMINE 30 MG/ML IJ SOLN
INTRAMUSCULAR | Status: DC | PRN
  Administered 2011-10-19: 60 mg via INTRAVENOUS

## 2011-10-19 MED ORDER — DEXAMETHASONE SODIUM PHOSPHATE 4 MG/ML IJ SOLN
INTRAMUSCULAR | Status: DC | PRN
  Administered 2011-10-19: 10 mg via INTRAVENOUS

## 2011-10-19 MED ORDER — ONDANSETRON HCL 4 MG/2ML IJ SOLN
INTRAMUSCULAR | Status: DC | PRN
  Administered 2011-10-19: 4 mg via INTRAVENOUS

## 2011-10-19 MED ORDER — FENTANYL CITRATE 0.05 MG/ML IJ SOLN
INTRAMUSCULAR | Status: AC
  Filled 2011-10-19: qty 5

## 2011-10-19 MED ORDER — FENTANYL CITRATE 0.05 MG/ML IJ SOLN
INTRAMUSCULAR | Status: DC | PRN
  Administered 2011-10-19 (×3): 50 ug via INTRAVENOUS
  Administered 2011-10-19: 100 ug via INTRAVENOUS
  Administered 2011-10-19 (×7): 50 ug via INTRAVENOUS

## 2011-10-19 MED ORDER — HYDROMORPHONE HCL PF 1 MG/ML IJ SOLN: 0.2500 mg | INTRAMUSCULAR | Status: DC | PRN

## 2011-10-19 MED ORDER — GLYCOPYRROLATE 0.2 MG/ML IJ SOLN
INTRAMUSCULAR | Status: AC
  Filled 2011-10-19: qty 1

## 2011-10-19 MED ORDER — ONDANSETRON HCL 4 MG PO TABS: 4.0000 mg | ORAL_TABLET | Freq: Three times a day (TID) | ORAL | Status: DC | PRN

## 2011-10-19 MED ORDER — IBUPROFEN 600 MG PO TABS: 600.0000 mg | ORAL_TABLET | Freq: Four times a day (QID) | ORAL | Status: DC | PRN

## 2011-10-19 MED ORDER — STERILE WATER FOR IRRIGATION IR SOLN
Status: DC | PRN
  Administered 2011-10-19: 3000 mL

## 2011-10-19 MED ORDER — DIPHENHYDRAMINE HCL 50 MG/ML IJ SOLN
INTRAMUSCULAR | Status: AC
  Filled 2011-10-19: qty 1

## 2011-10-19 MED ORDER — LIDOCAINE HCL (CARDIAC) 20 MG/ML IV SOLN
INTRAVENOUS | Status: AC
  Filled 2011-10-19: qty 5

## 2011-10-19 MED ORDER — BUPIVACAINE HCL (PF) 0.25 % IJ SOLN
INTRAMUSCULAR | Status: DC | PRN
  Administered 2011-10-19: 11 mL

## 2011-10-19 MED ORDER — PROPOFOL 10 MG/ML IV EMUL
INTRAVENOUS | Status: DC | PRN
  Administered 2011-10-19: 200 mg via INTRAVENOUS

## 2011-10-19 MED ORDER — LACTATED RINGERS IV SOLN
INTRAVENOUS | Status: DC
  Administered 2011-10-19 (×5): via INTRAVENOUS

## 2011-10-19 MED ORDER — ARTIFICIAL TEARS OP OINT
TOPICAL_OINTMENT | OPHTHALMIC | Status: AC
  Filled 2011-10-19: qty 3.5

## 2011-10-19 MED ORDER — FAMOTIDINE IN NACL 20-0.9 MG/50ML-% IV SOLN
20.0000 mg | Freq: Once | INTRAVENOUS | Status: AC
  Administered 2011-10-19: 20 mg via INTRAVENOUS
  Filled 2011-10-19: qty 50

## 2011-10-19 MED ORDER — ROCURONIUM BROMIDE 50 MG/5ML IV SOLN
INTRAVENOUS | Status: AC
  Filled 2011-10-19: qty 1

## 2011-10-19 MED ORDER — ONDANSETRON HCL 4 MG/2ML IJ SOLN
INTRAMUSCULAR | Status: AC
  Filled 2011-10-19: qty 2

## 2011-10-19 MED ORDER — BUPIVACAINE HCL (PF) 0.25 % IJ SOLN
INTRAMUSCULAR | Status: AC
  Filled 2011-10-19: qty 30

## 2011-10-19 MED ORDER — DIPHENHYDRAMINE HCL 50 MG/ML IJ SOLN
INTRAMUSCULAR | Status: DC | PRN
  Administered 2011-10-19: 50 mg via INTRAVENOUS

## 2011-10-19 MED ORDER — MIDAZOLAM HCL 2 MG/2ML IJ SOLN
INTRAMUSCULAR | Status: AC
  Filled 2011-10-19: qty 2

## 2011-10-19 MED ORDER — HYDROMORPHONE HCL PF 1 MG/ML IJ SOLN
INTRAMUSCULAR | Status: AC
  Filled 2011-10-19: qty 1

## 2011-10-19 MED ORDER — NEOSTIGMINE METHYLSULFATE 1 MG/ML IJ SOLN
INTRAMUSCULAR | Status: DC | PRN
  Administered 2011-10-19: 3 mg via INTRAVENOUS

## 2011-10-19 MED ORDER — PHENYLEPHRINE 40 MCG/ML (10ML) SYRINGE FOR IV PUSH (FOR BLOOD PRESSURE SUPPORT)
PREFILLED_SYRINGE | INTRAVENOUS | Status: AC
  Filled 2011-10-19: qty 5

## 2011-10-19 MED ORDER — MENTHOL 3 MG MT LOZG: 1.0000 | LOZENGE | OROMUCOSAL | Status: DC | PRN

## 2011-10-19 MED ORDER — PHENYLEPHRINE HCL 10 MG/ML IJ SOLN
INTRAMUSCULAR | Status: DC | PRN
  Administered 2011-10-19: 40 ug via INTRAVENOUS

## 2011-10-19 MED ORDER — NEOSTIGMINE METHYLSULFATE 1 MG/ML IJ SOLN
INTRAMUSCULAR | Status: AC
  Filled 2011-10-19: qty 10

## 2011-10-19 MED ORDER — LACTATED RINGERS IR SOLN
Status: DC | PRN
  Administered 2011-10-19: 3000 mL

## 2011-10-19 MED ORDER — HYDROXYZINE HCL 10 MG PO TABS
10.0000 mg | ORAL_TABLET | Freq: Three times a day (TID) | ORAL | Status: DC | PRN
  Filled 2011-10-19: qty 1

## 2011-10-19 MED ORDER — HYDROCODONE-ACETAMINOPHEN 5-500 MG PO TABS: 1.0000 | ORAL_TABLET | ORAL | Status: AC | PRN

## 2011-10-19 MED ORDER — DEXAMETHASONE SODIUM PHOSPHATE 10 MG/ML IJ SOLN
INTRAMUSCULAR | Status: AC
  Filled 2011-10-19: qty 1

## 2011-10-19 MED ORDER — INDIGOTINDISULFONATE SODIUM 8 MG/ML IJ SOLN
INTRAMUSCULAR | Status: DC | PRN
  Administered 2011-10-19: 5 mL via INTRAVENOUS

## 2011-10-19 MED ORDER — IOHEXOL 300 MG/ML  SOLN
INTRAMUSCULAR | Status: DC | PRN
  Administered 2011-10-19: 300 mL

## 2011-10-19 MED ORDER — INDIGOTINDISULFONATE SODIUM 8 MG/ML IJ SOLN
INTRAMUSCULAR | Status: AC
  Filled 2011-10-19: qty 5

## 2011-10-19 MED ORDER — FENTANYL CITRATE 0.05 MG/ML IJ SOLN
INTRAMUSCULAR | Status: AC
  Filled 2011-10-19: qty 2

## 2011-10-19 MED ORDER — LIDOCAINE HCL (CARDIAC) 20 MG/ML IV SOLN
INTRAVENOUS | Status: DC | PRN
  Administered 2011-10-19: 80 mg via INTRAVENOUS

## 2011-10-19 MED ORDER — GLYCOPYRROLATE 0.2 MG/ML IJ SOLN
INTRAMUSCULAR | Status: AC
  Filled 2011-10-19: qty 2

## 2011-10-19 MED ORDER — MIDAZOLAM HCL 5 MG/5ML IJ SOLN
INTRAMUSCULAR | Status: DC | PRN
  Administered 2011-10-19: 2 mg via INTRAVENOUS

## 2011-10-19 MED ORDER — KETOROLAC TROMETHAMINE 60 MG/2ML IM SOLN
INTRAMUSCULAR | Status: AC
  Filled 2011-10-19: qty 2

## 2011-10-19 MED ORDER — HYDROCODONE-ACETAMINOPHEN 5-325 MG PO TABS
1.0000 | ORAL_TABLET | Freq: Four times a day (QID) | ORAL | Status: DC | PRN
  Administered 2011-10-19: 1 via ORAL
  Filled 2011-10-19: qty 1

## 2011-10-19 MED ORDER — GLYCOPYRROLATE 0.2 MG/ML IJ SOLN
INTRAMUSCULAR | Status: DC | PRN
  Administered 2011-10-19: .6 mg via INTRAVENOUS

## 2011-10-19 MED ORDER — MINERAL OIL LIGHT 100 % EX OIL
1.0000 "application " | TOPICAL_OIL | Freq: Once | CUTANEOUS | Status: AC
  Administered 2011-10-19: 1 via TOPICAL
  Filled 2011-10-19: qty 25

## 2011-10-19 MED ORDER — LACTATED RINGERS IV SOLN: INTRAVENOUS | Status: DC

## 2011-10-19 MED ORDER — PROPOFOL 10 MG/ML IV EMUL
INTRAVENOUS | Status: AC
  Filled 2011-10-19: qty 20

## 2011-10-19 SURGICAL SUPPLY — 81 items
ADAPTER CATH URET PLST 4-6FR (CATHETERS) IMPLANT
BAG URINE DRAINAGE (UROLOGICAL SUPPLIES) ×3 IMPLANT
BARRIER ADHS 3X4 INTERCEED (GAUZE/BANDAGES/DRESSINGS) IMPLANT
BENZOIN TINCTURE PRP APPL 2/3 (GAUZE/BANDAGES/DRESSINGS) ×3 IMPLANT
CABLE HIGH FREQUENCY MONO STRZ (ELECTRODE) ×3 IMPLANT
CATH FOLEY 3WAY  5CC 16FR (CATHETERS) ×1
CATH FOLEY 3WAY  5CC 18FR (CATHETERS) ×1
CATH FOLEY 3WAY 5CC 16FR (CATHETERS) ×2 IMPLANT
CATH FOLEY 3WAY 5CC 18FR (CATHETERS) ×2 IMPLANT
CATH INTERMIT  6FR 70CM (CATHETERS) IMPLANT
CHLORAPREP W/TINT 26ML (MISCELLANEOUS) ×3 IMPLANT
CLOTH BEACON ORANGE TIMEOUT ST (SAFETY) ×6 IMPLANT
CONT PATH 16OZ SNAP LID 3702 (MISCELLANEOUS) ×3 IMPLANT
CORDS BIPOLAR (ELECTRODE) IMPLANT
COVER MAYO STAND STRL (DRAPES) ×3 IMPLANT
COVER TABLE BACK 60X90 (DRAPES) ×6 IMPLANT
COVER TIP SHEARS 8 DVNC (MISCELLANEOUS) ×2 IMPLANT
COVER TIP SHEARS 8MM DA VINCI (MISCELLANEOUS) ×1
DECANTER SPIKE VIAL GLASS SM (MISCELLANEOUS) ×3 IMPLANT
DERMABOND ADVANCED (GAUZE/BANDAGES/DRESSINGS) ×1
DERMABOND ADVANCED .7 DNX12 (GAUZE/BANDAGES/DRESSINGS) ×2 IMPLANT
DRAPE CAMERA CLOSED 9X96 (DRAPES) ×3 IMPLANT
DRAPE HUG U DISPOSABLE (DRAPE) ×3 IMPLANT
DRAPE HYSTEROSCOPY (DRAPE) ×3 IMPLANT
DRAPE LG THREE QUARTER DISP (DRAPES) ×6 IMPLANT
DRAPE MONITOR DA VINCI (DRAPE) IMPLANT
DRAPE WARM FLUID 44X44 (DRAPE) ×3 IMPLANT
DRSG TEGADERM 4X4.75 (GAUZE/BANDAGES/DRESSINGS) IMPLANT
ELECT REM PT RETURN 9FT ADLT (ELECTROSURGICAL) ×3
ELECTRODE REM PT RTRN 9FT ADLT (ELECTROSURGICAL) ×2 IMPLANT
EVACUATOR SMOKE 8.L (FILTER) ×3 IMPLANT
GAUZE VASELINE 3X9 (GAUZE/BANDAGES/DRESSINGS) IMPLANT
GLOVE BIOGEL M STRL SZ7.5 (GLOVE) ×3 IMPLANT
GLOVE BIOGEL PI IND STRL 7.0 (GLOVE) ×6 IMPLANT
GLOVE BIOGEL PI INDICATOR 7.0 (GLOVE) ×3
GLOVE ECLIPSE 6.5 STRL STRAW (GLOVE) ×9 IMPLANT
GOWN STRL NON-REIN LRG LVL3 (GOWN DISPOSABLE) ×3 IMPLANT
GOWN STRL REIN XL XLG (GOWN DISPOSABLE) ×21 IMPLANT
GRASPER BIPOLAR FEN DA VINCI (INSTRUMENTS)
GRASPER BPLR FEN DVNC (INSTRUMENTS) IMPLANT
GUIDEWIRE 0.038 PTFE COATED (WIRE) IMPLANT
GUIDEWIRE STR DUAL SENSOR (WIRE) IMPLANT
IMPLANT RECORD ×3 IMPLANT
KIT ACCESSORY DA VINCI DISP (KITS) ×1
KIT ACCESSORY DVNC DISP (KITS) ×2 IMPLANT
KIT DISP ACCESSORY 4 ARM (KITS) IMPLANT
MICROVASIVE GUIDEWIRE  ANGLED .035 ×3 IMPLANT
NEEDLE HYPO 22GX1.5 SAFETY (NEEDLE) ×3 IMPLANT
NS IRRIG 1000ML POUR BTL (IV SOLUTION) ×3 IMPLANT
OCCLUDER COLPOPNEUMO (BALLOONS) IMPLANT
PACK LAVH (CUSTOM PROCEDURE TRAY) ×3 IMPLANT
PACK VAGINAL MINOR WOMEN LF (CUSTOM PROCEDURE TRAY) IMPLANT
PAD PREP 24X48 CUFFED NSTRL (MISCELLANEOUS) ×6 IMPLANT
PLUG CATH AND CAP STER (CATHETERS) ×3 IMPLANT
PROTECTOR NERVE ULNAR (MISCELLANEOUS) ×6 IMPLANT
SET CYSTO W/LG BORE CLAMP LF (SET/KITS/TRAYS/PACK) IMPLANT
SET IRRIG TUBING LAPAROSCOPIC (IRRIGATION / IRRIGATOR) ×3 IMPLANT
SOLUTION ELECTROLUBE (MISCELLANEOUS) ×3 IMPLANT
SPONGE LAP 18X18 X RAY DECT (DISPOSABLE) IMPLANT
STRIP CLOSURE SKIN 1/2X4 (GAUZE/BANDAGES/DRESSINGS) ×3 IMPLANT
STRYKER ×3 IMPLANT
SUT MNCRL AB 3-0 PS2 27 (SUTURE) IMPLANT
SUT VIC AB 0 CT1 27 (SUTURE) ×6
SUT VIC AB 0 CT1 27XBRD ANBCTR (SUTURE) ×4 IMPLANT
SUT VIC AB 0 CT1 27XBRD ANTBC (SUTURE) ×8 IMPLANT
SUT VICRYL 0 UR6 27IN ABS (SUTURE) ×6 IMPLANT
SYR 50ML LL SCALE MARK (SYRINGE) ×3 IMPLANT
SYSTEM CONVERTIBLE TROCAR (TROCAR) ×3 IMPLANT
TIP UTERINE 5.1X6CM LAV DISP (MISCELLANEOUS) IMPLANT
TIP UTERINE 6.7X10CM GRN DISP (MISCELLANEOUS) IMPLANT
TIP UTERINE 6.7X6CM WHT DISP (MISCELLANEOUS) IMPLANT
TIP UTERINE 6.7X8CM BLUE DISP (MISCELLANEOUS) IMPLANT
TOWEL OR 17X24 6PK STRL BLUE (TOWEL DISPOSABLE) ×9 IMPLANT
TROCAR 12M 150ML BLUNT (TROCAR) ×3 IMPLANT
TROCAR DISP BLADELESS 8 DVNC (TROCAR) ×2 IMPLANT
TROCAR DISP BLADELESS 8MM (TROCAR) ×1
TROCAR XCEL 12X100 BLDLESS (ENDOMECHANICALS) ×3 IMPLANT
TUBING CONNECTING 10 (TUBING) ×3 IMPLANT
TUBING FILTER THERMOFLATOR (ELECTROSURGICAL) ×3 IMPLANT
WARMER LAPAROSCOPE (MISCELLANEOUS) ×3 IMPLANT
WATER STERILE IRR 1000ML POUR (IV SOLUTION) ×9 IMPLANT

## 2011-10-19 NOTE — Transfer of Care (Signed)
Immediate Anesthesia Transfer of Care Note  Patient: Margaret Hughes  Procedure(s) Performed: Procedure(s) (LRB): ROBOTIC ASSISTED TOTAL HYSTERECTOMY (N/A) CYSTOSCOPY WITH RETROGRADE PYELOGRAM, URETEROSCOPY AND STENT PLACEMENT (Bilateral)  Patient Location: PACU  Anesthesia Type: General  Level of Consciousness: awake  Airway & Oxygen Therapy: Patient Spontanous Breathing and Patient connected to nasal cannula oxygen  Post-op Assessment: Report given to PACU RN and Post -op Vital signs reviewed and stable  Post vital signs: Reviewed and stable  Complications: No apparent anesthesia complications

## 2011-10-19 NOTE — Anesthesia Preprocedure Evaluation (Addendum)
Anesthesia Evaluation  Patient identified by MRN, date of birth, ID band Patient awake    Reviewed: Allergy & Precautions, H&P , Patient's Chart, lab work & pertinent test results, reviewed documented beta blocker date and time   Airway Mallampati: II TM Distance: >3 FB Neck ROM: full    Dental No notable dental hx. (+) Dental Advisory Given,    Pulmonary  breath sounds clear to auscultation  Pulmonary exam normal       Cardiovascular Rhythm:regular Rate:Normal     Neuro/Psych    GI/Hepatic   Endo/Other    Renal/GU      Musculoskeletal   Abdominal   Peds  Hematology   Anesthesia Other Findings   Reproductive/Obstetrics                          Anesthesia Physical Anesthesia Plan  ASA: II  Anesthesia Plan: General   Post-op Pain Management:    Induction: Intravenous  Airway Management Planned: Oral ETT  Additional Equipment:   Intra-op Plan:   Post-operative Plan:   Informed Consent: I have reviewed the patients History and Physical, chart, labs and discussed the procedure including the risks, benefits and alternatives for the proposed anesthesia with the patient or authorized representative who has indicated his/her understanding and acceptance.   Dental Advisory Given and Dental advisory given  Plan Discussed with: CRNA and Surgeon  Anesthesia Plan Comments: (  Discussed  general anesthesia, including possible nausea, instrumentation of airway, sore throat,pulmonary aspiration, etc. I asked if the were any outstanding questions, or  concerns before we proceeded. )        Anesthesia Quick Evaluation

## 2011-10-19 NOTE — H&P (View-Only) (Signed)
Margaret Hughes is a 51 y.o. married black female G1P0010 who presents for a robot assisted total laparoscopic hysterectomy for symptomatic uterine fibroids.  Patient has a longstanding history, of symptomatic uterine fibroids,  that has not been responsive to oral contraceptives.  One week prior to her menses, the patient experiences cramping, rated at 8.5/10,  that continues during her three day period.  Patient's flow requires her to change a pad twice a day and she refrains from taking more than a half of a Tylenol for pain because it "knocks her out".  She admits to pelvic pressure and dyspareunia however, denies vaginitis symptoms, urinary urgency, incontinence or intermenstrual bleeding.   Pelvic ultrasound, 07/12/11 showed, uterus (retroverted)  8.22 x 7.91 x 3.49 cm, with #4 measurable fibroids: posterior-7.9 x 6.6 x 7.4 cm;  posterior/subserosal-3.4 x 4.3 x 3.4 cm; fundal/intramural- 1.7 x 1.5 x 1.8 cm  and anterior intramural-2.8 x 2.4 x 2.7 cm; left ovary 1.73 x 1.66 x 1.48 cm and right ovary 2.38 x 1.09 x 1.12 cm.  A review of both medical and surgical management options for her symptoms, to include observation.  After careful consideration, patient has decided to proceed with definitive therapy in the form of hysterectomy.  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  and abnormal PAP smears but for the last five years they have been normal;  Last PAP smear 2013  Medical History: thyroid disease, ectopic kidneys, irritable bowel syndrome, vertigo, asthma, migraines  Surgical History: 1990 & 2007  Right Knee Surgery; 1998 Pilonidal Cyst Excision Denies problems with anesthesia or history of blood transfusions, however, she reports that she is super sensitive medications in general and pain medication in particular (typically needs much less than  standard dosages)  Family History: Cancer, thyroid disease, diabetes, hypertension  Social History:  Married, Informational Technology Specialist; 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 cetirizine (ZYRTEC) 10 MG tablet Take 10 mg by mouth daily.      . drospirenone-ethinyl estradiol (SYEDA) 3-0.03 MG tablet Take 1 tablet by mouth daily.      . fluticasone (FLONASE) 50 MCG/ACT nasal spray Place 2 sprays into the nose daily.      . HYDROcodone-acetaminophen (NORCO) 5-325 MG per tablet Take 1 tablet by mouth every 6 (six) hours as needed. For stomach pain      . hydrOXYzine (ATARAX/VISTARIL) 10 MG tablet Take 10 mg by mouth 3 (three) times daily as needed. For itching      . levothyroxine (SYNTHROID, LEVOTHROID) 125 MCG tablet Take 125 mcg by mouth daily.      . Multiple Vitamin (MULTIVITAMIN) tablet Take 1 tablet by mouth daily.      . ondansetron (ZOFRAN) 4 MG tablet Take 4 mg by mouth every 8 (eight) hours as needed. For nausea      . predniSONE (DELTASONE) 50 MG tablet Pt to take 1 pill at 13 hrs prior, 7 hours prior and 1 hour prior to procedure. Pt to also take 1 Benadryl 50 mg at same time as last prednisone.  3 tablet  0    Allergies  Allergen Reactions  . Iodine Swelling  . Iohexol      Code: SNEEZE, Desc: facial swelling after iodine injection, pt ok w/   13 hr prep//a.c., Onset Date: [id number]   . Tape Hives    Plastic tape   Denies soy, latex, shellfish, or peanut sensitivity  ROS: Admits to occasional right pain Denies headache, vision changes, dysphagia, tinnitus, dizziness,  chest pain, shortness of breath, nausea, vomiting, diarrhea, dysuria, hematuria, pelvic pain, swelling of joints,easy bruising,  myalgias, arthralgias, skin rashes and except as is mentioned in the history of present illness, patient's review of systems is otherwise negative  Physical Exam    BP 102/70  Pulse  68  Temp 98.1 F (36.7 C) (Oral)  Resp 16  Ht 5' 3.75" (1.619 m)  Wt 173 lb (78.472 kg)  BMI 29.93 kg/m2  LMP 09/15/2011  Neck: supple without masses or thyromegaly Lungs: clear to auscultation Heart: regular rate and rhythm Abdomen: soft, non-tender and no organomegaly Pelvic:EGBUS- wnl; vagina-normal rugae; uterus-retroverted, irregular, 10-12 weeks,  small cervix;  adnexae-no tenderness or masses Extremities:  no clubbing, cyanosis or edema  Assesment: Symptomatic Uterine Fibroids                      Ectopic Kidney   Disposition: The patient was given the indication for her procedure(s) along with the risks, which include but are not limited to, reaction to anesthesia, damage to adjacent organs, infection, excessive bleeding, formation of scar tissue, early menopause, pelvic prolapse and the possible need for an open abdominal incision. She was further advised that she will experience transient post operative facial edema, that her hospital stay is expected to be 0-2 days, she should be able to return to her usual activities within 2-3 weeks (except intercourse to be delayed until 6 weeks) and that the robotic approach to her surgery requires more time to perform than an open abdominal approach.  Patient was given the Miralax bowel prep to be completed 24 hours prior to procedure.  She verbalized understanding of these risks and pre-operative instructions and has consented to proceed with a robot assisted laparoscopic hysterectomy at [facility] on October 19, 2011 at 7:30 a.m. preceded by the placement of ureteral stents that will be removed post procedure.  CSN# [id number]   Uzma Hellmer J. Enes Wegener, PA-C  for Dr. Rivard  

## 2011-10-19 NOTE — Anesthesia Postprocedure Evaluation (Signed)
Anesthesia Post Note  Patient: Margaret Hughes  Procedure(s) Performed: Procedure(s) (LRB): ROBOTIC ASSISTED TOTAL HYSTERECTOMY (N/A) CYSTOSCOPY WITH RETROGRADE PYELOGRAM, URETEROSCOPY AND STENT PLACEMENT (Bilateral)  Anesthesia type: General  Patient location: PACU  Post pain: Pain level controlled  Post assessment: Post-op Vital signs reviewed  Last Vitals:  Filed Vitals:   10/19/11 1400  BP: 109/67  Pulse: 71  Temp: 36.7 C  Resp: 12    Post vital signs: Reviewed  Level of consciousness: sedated  Complications: No apparent anesthesia complications

## 2011-10-19 NOTE — Op Note (Signed)
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  Anesthesiologist: Dr. Cristela Blue  Procedure: Robotic assisted total hysterectomy after placement of ureteral stents by Dr. Patsi Sears  Surgeon: Dr. Dois Davenport Johari Bennetts  Assistant: Henreitta Leber PA-C  Estimated blood loss: 50 cc  Procedure:  After being informed of the planned procedure with possible complications including bleeding, infection, injury to other organs, need for laparotomy, expected hospitals they in recovery, informed consent is obtained and patient is taken to or #7. She is placed in lithotomy position on a sticky mattress and beanbag with both arms padded and tucked on each side and knee-high sequential compressive devices. She's prepped and draped in a sterile fashion  Dr. Patsi Sears proceed with cystoscopy and placement of lighted ureteral stents please see attached operative note.  Pelvic exam reveals an enlarged uterus approximately 16 weeks in size with a dominant posterior fibroid and 2 adnexa that are not felt. A weighted speculum is inserted in the vagina and the anterior lip of the cervix is grasped with a tenaculum forcep. Uterus is sounded at 9.5 cm and the cervix was easily dilated using Hegar dilator which allows for easy placement of a #8 RUMI intrauterine manipulator with the 3.0 KOH ring. The ring is sutured to the cervix with 0 Vicryl.  We infiltrate 5 cm above the umbilicus with 5 cc of Marcaine 0.25 and perform a vertical 10 mm incision which is brought down bluntly to the fascia. The fascia is grasped to Coker forceps and incised with Mayo scissors. Despite multiple attempts to open the peritoneum we were still unable to reach it. A Veress needle is inserted in the umbilical area to insufflate a pneumoperitoneum using warm CO2 at a maximum pressure of 15 mm of mercury. Is then allows for easy placement of  a 10 mm Hassan trocar through are vertical incision. The trocar is held in placed with a Pursetring suture of 0 Vicryl placed on the fascia.  We then placed 2 8mm robotic trocar on the left 1 8mm robotic trocar on the right in one 10 mm patient side assistant trocar on the right. All sites were previously infiltrated with Marcaine 0.25 and all trochars were inserted under direct  visualization. The robot is docked on the left. Monopolar scissors is inserted in arm #1, a PK gyrus forcep is inserted in arm #2, Cobra forcep is inserted in arm #3. Vaginal preparation instrumentation and docking is completed in 34 minutes.  Observation:  The uterus is enlarged with multiple fibroids with the dominant being posterior measuring 9 cm. We can easily see both ureters which have a normal course through the pelvic area. The right ureter are then at the pelvic brim move to the left to join the lower portion of the ectopic kidney. The right round ligament is rotated towards the left and so is the tube and the right utero-ovarian ligament. Both tubes are normal in both ovaries are normal. Liver is normal gallbladder is normal. Appendix is not visualized.  We start with the right side and cauterized the tube and sectioned at cauterize the round ligament and sectioned at cauterize the utero-ovarian ligament and sectioned it. This gives easy access into the broad ligament which was sharply dissected posteriorly until it meets the coring which is easily identified. We can then dissect the broad ligament anteriorly all the way to the level of the coring but not across the lower uterine segment to do  to the bulk of the uterus. The vascular pedicle is easily skeletonize keeping the ureter under lighted visualization. Moving to the left, we cauterize the tube and sectioned it, cauterize the utero-ovarian ligament and sectioned at and cauterized the round ligament and sectioned. This also gives [place] an easy entry into the broad  ligament which is dissected posteriorly all the way to the coring keeping the ureter under lighted visualization. The anterior broad ligament is then dissected downwards and anteriorly and across the lower uterine segment to meet the opening on the right side. We can now safely dissect the bladder down until the coring is complete easily visualized. The bladder is filled with 200 cc of saline to confirm an appropriate dissection.  Returning to the left vascular bundle. We proceed with skeletonization until we can easily visualize the vessels at the level of the coring. We can then cauterized the ascending uterine artery keeping the ureter under direct position. This pedicle is sectioned then proceed with cauterization of the right uterine artery with pressure at its ascending level at the coring. We can now easily perform a 360 colpotomy with an open monopolar scissors freeing the uterus entirely.  The uterus is then delivered vaginally with on 30 minutes of vaginal morcellation.  Returning to console, modifying our instruments for a suture cut in arm #1 and a needle holder and arm #2, we proceed with closure of the vaginal cuff with figure-of-eight stitches of 0 Vicryl. We then profusely irrigated the pelvis to confirm a satisfactory hemostasis. All pedicles are hemostatic. And we still have a very good view of the pelvic portion of both ureters that are still lighted with the lighted stents. The robot is undocked. Console time was 1 hour and 35 minutes.  All trochars are then removed under direct position after evacuating the pneumoperitoneum. The fascia of the supraumbilical incision is closed with a Purstring suture of 0 Vicryl. The fascia of the patient side 10 mm trocar is closed with a figure-of-eight stitch of 0 Vicryl. The skin of all incision is closed with subcuticular suture of 3-0 Monocryl and Dermabond.  Vaginal exam reveals an appropriately closed vaginal cuff with satisfactory  hemostasis.  Instrument and sponge count is complete x2. Estimated blood loss is 50 cc. The procedures very well tolerated by the patient is taken to recovery room in a well and stable condition. The ureteral stents have been removed under direct visitation with a Foley catheter has remained in place.  Specimen: Uterus weighing 345 g sent to pathology

## 2011-10-19 NOTE — Op Note (Signed)
Pre-operative diagnosis : Symptomatic uterine fibroids with congenital renal ectopia  Postoperative diagnosis: Same  Operation: Cystourethroscopy, Bilateral retrograde pyelograms with interpretation, bilateral lighted ureteral stent placement ( 6 F).   Surgeon:  Kathie Rhodes. Patsi Sears, MD  First assistant: None  Anesthesia:  GET  Preparation: After appropriate preparation, the patient was brought into the operating room and placed upon the OR table in the supine position where GET was administered. The armband was re-checked, and the patient was placed in the dorsal lithotomy position, and prepped with  Non-iodine solution and draped in the usual fashion.   Review history: Margaret Hughes is a 51 y.o. married black female [id number] who presents for a robot assisted total laparoscopic hysterectomy for symptomatic uterine fibroids.  Patient has a longstanding history, of symptomatic uterine fibroids, that has not been responsive to oral contraceptives. One week prior to her menses, the patient experiences cramping, rated at 8.5/10, that continues during her three day period. Patient's flow requires her to change a pad twice a day and she refrains from taking more than a half of a Tylenol for pain because it "knocks her out". She admits to pelvic pressure and dyspareunia however, denies vaginitis symptoms, urinary urgency, incontinence or intermenstrual bleeding.  Pelvic ultrasound, 07/12/11 showed, uterus (retroverted) 8.22 x 7.91 x 3.49 cm, with #4 measurable fibroids: posterior-7.9 x 6.6 x 7.4 cm; posterior/subserosal-3.4 x 4.3 x 3.4 cm; fundal/intramural- 1.7 x 1.5 x 1.8 cm and anterior intramural-2.8 x 2.4 x 2.7 cm; left ovary 1.73 x 1.66 x 1.48 cm and right ovary 2.38 x 1.09 x 1.12 cm.    Review of her CT with Radiology shows that she has CROSSED-FUSED ECTOPIA of the kidneys, with a single R ureter eminating from the L lower kidney and rising and draping over the uterus and inserting into the R side of the  bladder; and a single L upper ureter draining into the L bladder.      Statement of  Likelihood of Success: Excellent. TIME-OUT observed.:  Procedure: BUS examination shows normal external female examination, and the vagina is well estrogenized. The urethra is normal. There are no vaginal masses present. Cystoscopy was performed, and shows normal evidence of urethra and bladder neck. The trigone appears normal. There are 2 ureteral orifice used, one on each side of the trigone. They are somewhat laterally placed, however, and difficult to instrument. I am unable to catheterize either orifice, and therefore, a 038 Glidewire is used to cannulate the ureteral orifice. Under fluoroscopic control, the right-sided Glidewire is passed up the ureter, and over the pelvic mass into the renal pelvis. This is captured on fluoroscopic examination for permanent examination. The 6 [place] open-ended catheter is then passed over the Glidewire, and the wire is removed, and retrograde PolyGram is performed, which shows a slightly dilated right ureter, up to the level of a lower kidney on the left side. The ureter is identified in its entirety. The patient is noted to have crossed fused ectopia, as noted on preop CT scan. The guidewire is then replaced, and the lighted stent guide is then placed. The cystoscope was then removed over the guide, and the lighted stent is placed through the guide to the second mark, as instructed. The fluoroscope was used to ensure that the guide is up to the appropriate marked. Following this, attention is directed to the left ureteral orifice. The cystoscope was then replaced, and again, a guidewire was needed to cannulate the left ureteral orifice, in order to pass a 6  French catheter. The guidewire was removed, and retrograde pyelogram showed a normal caliber left ureter, 2 in upper kidney without hydronephrosis. There is no stone no tumor noted. The Glidewire was removed, and the lighted stent  cannula was placed. 6 French catheter was removed. The lighted stent was placed to the second mark is previously noted. Both lighted stents were left in position, and attached to the generator. Foley cath was placed. The patient was now ready for probiotic hysterectomy.

## 2011-10-19 NOTE — Discharge Summary (Signed)
  Physician Discharge Summary  Patient ID: Margaret Hughes MRN: [id number] DOB/AGE: Aug 24, 1960 51 y.o.  Admit date: 10/19/2011 Discharge date: 10/19/2011   Discharge Diagnoses: Symptomatic Uterine Fibroids and Ectopic Kidneys Active Problems:  * No active hospital problems. *    Operation: Placement of Lighted Ureteral Stents and Robot Assisted Total Laparoscopic Hysterectomy   Discharged Condition: Stable  Hospital Course: On the date of admission the patient underwent aforementioned procedures, tolerating them all well.  Post operative course was unremarkable with patient resuming bowel and bladder function, ambulation and adequate analgesia on the same day of surgery and therefore deemed ready for discharge home.  Disposition: 01-Home or Self Care  Discharge Medications:   [name], [name]  Home Medication Instructions [id number]   Printed on:10/19/11 1310  Medication Information                    levothyroxine (SYNTHROID, LEVOTHROID) 125 MCG tablet Take 125 mcg by mouth daily.           cetirizine (ZYRTEC) 10 MG tablet Take 10 mg by mouth daily.           Multiple Vitamin (MULTIVITAMIN) tablet Take 1 tablet by mouth daily.           fluticasone (FLONASE) 50 MCG/ACT nasal spray Place 2 sprays into the nose daily.           predniSONE (DELTASONE) 50 MG tablet Pt to take 1 pill at 13 hrs prior, 7 hours prior and 1 hour prior to procedure. Pt to also take 1 Benadryl 50 mg at same time as last prednisone.           HYDROcodone-acetaminophen (NORCO) 5-325 MG per tablet Take 1 tablet by mouth every 6 (six) hours as needed. For stomach pain           ondansetron (ZOFRAN) 4 MG tablet Take 4 mg by mouth every 8 (eight) hours as needed. For nausea           hydrOXYzine (ATARAX/VISTARIL) 10 MG tablet Take 10 mg by mouth 3 (three) times daily as needed. For itching           ibuprofen (ADVIL,MOTRIN) 600 MG tablet 1 tablet with food every 6 hours for 5 days then prn        Follow-up: Dr. Estanislado Pandy,  November 10, 2011 @ 3:30 pm   Signed: Patrick Jupiter 10/19/2011, 1:10 PM

## 2011-10-19 NOTE — Preoperative (Signed)
Beta Blockers   Reason not to administer Beta Blockers:Not Applicable 

## 2011-10-19 NOTE — Interval H&P Note (Signed)
History and Physical Interval Note:  10/19/2011 7:22 AM  Margaret Hughes  has presented today for surgery, with the diagnosis of Fibroids  The various methods of treatment have been discussed with the patient and family. After consideration of risks, benefits and other options for treatment, the patient has consented to  Procedure(s) (LRB): ROBOTIC ASSISTED TOTAL HYSTERECTOMY (N/A) CYSTOSCOPY WITH RETROGRADE PYELOGRAM, URETEROSCOPY AND STENT PLACEMENT (Bilateral) as a surgical intervention .  The patient's history has been reviewed, patient examined, no change in status, stable for surgery.  I have reviewed the patients' chart and labs.  Questions were answered to the patient's satisfaction.     Masoud Nyce A

## 2011-10-20 ENCOUNTER — Telehealth: Payer: Self-pay | Admitting: Obstetrics and Gynecology

## 2011-10-20 NOTE — Telephone Encounter (Signed)
Called patient at home. Feeling good. Rested well overnight. Pain is well managed. No nausea or vomiting. Voiding is normal. No vaginal bleeding. Op findings reviewed with patient. Post-op instructions reviewed also.

## 2011-10-22 ENCOUNTER — Other Ambulatory Visit: Payer: Self-pay

## 2011-10-22 ENCOUNTER — Telehealth: Payer: Self-pay

## 2011-10-22 MED ORDER — HYDROCODONE-ACETAMINOPHEN 5-300 MG PO TABS: ORAL_TABLET | ORAL | Status: DC

## 2011-10-22 MED ORDER — CIPROFLOXACIN HCL 500 MG PO TABS: ORAL_TABLET | ORAL | Status: DC

## 2011-10-22 MED ORDER — PHENAZOPYRIDINE HCL 200 MG PO TABS: ORAL_TABLET | ORAL | Status: DC

## 2011-10-22 NOTE — Telephone Encounter (Signed)
Per EP phone call.  Pt to have vicodin 5/300 #30 no refills.  1 - 2 q 4-6 hours prn pain.  [facility]

## 2011-11-01 ENCOUNTER — Telehealth: Payer: Self-pay | Admitting: Obstetrics and Gynecology

## 2011-11-01 NOTE — Telephone Encounter (Signed)
This is a late entry for a phone call received (after hours) on October 22, 2011 with patient complaining of 1.5 days of increasing pelvic pain, nausea, pressure with urination and voiding every 15-20 minutes.  Pain was temporarily relieved with Vicodin but denies fever or flank pain. Patient did not want to be seen in the office/MAU.  Prescribed Cipro 500 mg  # 14 bid x 7 days along with Pyridium 200 mg # 9  tid and Vicodin #30 1-2 every 4-6 hours prn no refills.  Advised to call if symptoms worsen or if symptoms did not improve in 48 hours.  Sausha Raymond, PA-C

## 2011-11-09 ENCOUNTER — Encounter: Payer: Federal, State, Local not specified - PPO | Admitting: Obstetrics and Gynecology

## 2011-11-10 ENCOUNTER — Ambulatory Visit (INDEPENDENT_AMBULATORY_CARE_PROVIDER_SITE_OTHER): Payer: Federal, State, Local not specified - PPO | Admitting: Obstetrics and Gynecology

## 2011-11-10 ENCOUNTER — Encounter: Payer: Self-pay | Admitting: Obstetrics and Gynecology

## 2011-11-10 VITALS — BP 110/68 | Wt 174.0 lb

## 2011-11-10 DIAGNOSIS — D259 Leiomyoma of uterus, unspecified: Secondary | ICD-10-CM

## 2011-11-10 NOTE — Patient Instructions (Signed)
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

## 2011-11-10 NOTE — Progress Notes (Signed)
Surgery: 10/19/2011  Date: Robotic Hysterectomy   Eating a regular diet without difficulty. Bowel movements are not normal constipation : stool softener and Dulcolax. Pain is controlled with current analgesics. Medications being used: aspirin.  Bladder function is returned to normal. Had post-op UTI treated (Ureteral stents pre-op) Vaginal bleeding: none Vaginal discharge: no vaginal discharge    Subjective:     Margaret Hughes is a 51 y.o. female who presents for post-op visit.  Pathology report:   Uterus and cervix - WEAKLY PROLIFERATIVE / INACTIVE ENDOMETRIUM; NEGATIVE FOR HYPERPLASIA OR MALIGNANCY. - MULTIPLE BENIGN LEIOMYOMATA (UP TO 6 CM). - BENIGN CERVICAL MUCOSA; NEGATIVE FOR INTRAEPITHELIAL LESION OR MALIGNANCY. - UNREMARKABLE UTERINE SEROSA. was reviewed with patient.  The following portions of the patient's history were reviewed and updated as appropriate: allergies, current medications, past family history, past medical history, past social history, past surgical history and problem list.  Review of Systems Pertinent items are noted in HPI.   Objective:    BP 110/68  Wt 174 lb (78.926 kg)  LMP 09/15/2011 Weight:  Wt Readings from Last 1 Encounters:  11/10/11 174 lb (78.926 kg)    BMI: There is no height on file to calculate BMI.  General Appearance: Alert, appropriate appearance for age. No acute distress Lungs: clear to auscultation bilaterally Back: No CVA tenderness Cardiovascular: Regular rate and rhythm. S1, S2, no murmur Gastrointestinal: Soft, non-tender, no masses or organomegaly Incision/s: healing well Pelvic Exam: vaginal cuff healing well   Bimanual exam normal  Assessment:    Doing well postoperatively. Operative findings again reviewed.   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

## 2011-12-10 ENCOUNTER — Encounter: Payer: Self-pay | Admitting: Obstetrics and Gynecology

## 2011-12-10 ENCOUNTER — Ambulatory Visit (INDEPENDENT_AMBULATORY_CARE_PROVIDER_SITE_OTHER): Payer: Federal, State, Local not specified - PPO | Admitting: Obstetrics and Gynecology

## 2011-12-10 VITALS — BP 112/62 | Temp 98.6°F | Ht 63.75 in | Wt 177.0 lb

## 2011-12-10 DIAGNOSIS — D259 Leiomyoma of uterus, unspecified: Secondary | ICD-10-CM

## 2011-12-10 DIAGNOSIS — D219 Benign neoplasm of connective and other soft tissue, unspecified: Secondary | ICD-10-CM

## 2011-12-10 NOTE — Progress Notes (Signed)
Surgery: Supreacervical robotic Hyst  Date: 10/29/2011  Eating a regular diet without difficulty. Bowel movements are abnormal with some constipation.  Having some pain mostly on left side also when bending over. States that it tylenol does help with pain.   Bladder function is returned to normal. Vaginal bleeding: none Vaginal discharge: no vaginal discharge   Subjective:     Margaret Hughes is a 51 y.o. female who presents for 2nd post-op visit.  The following portions of the patient's history were reviewed and updated as appropriate: allergies, current medications, past family history, past medical history, past social history, past surgical history and problem list.  Review of Systems Pertinent items are noted in HPI.   Objective:    BP 112/62  Temp 98.6 F (37 C) (Oral)  Ht 5' 3.75" (1.619 m)  Wt 177 lb (80.287 kg)  BMI 30.62 kg/m2  LMP 09/15/2011 Weight:  Wt Readings from Last 1 Encounters:  12/10/11 177 lb (80.287 kg)    BMI: Body mass index is 30.62 kg/(m^2).  General Appearance: Alert, appropriate appearance for age. No acute distress Gastrointestinal: Soft, non-tender, no masses or organomegaly Incision/s: healing well Pelvic Exam: Bimanual exam normal  Assessment:  Hot flashes and night sweats  Doing well postoperatively. Operative findings again reviewed.   Plan:   Normal post-op exam.  Will try natural supplements and Derma Therapy. May call to start ERT (R&B and [facility] reviewed)  Silverio Lay MD 9/6/20138:57 AM

## 2011-12-10 NOTE — Patient Instructions (Signed)
Patient Education Materials to be provided at check out (*indicates is located in accordion folder):  Herbal Products, Hormone Replacement Therapy, Menopause Years, *Calcium Recommendations  

## 2012-04-05 HISTORY — PX: SHOULDER ARTHROSCOPY: SHX128

## 2013-05-15 ENCOUNTER — Other Ambulatory Visit: Payer: Self-pay | Admitting: *Deleted

## 2013-05-15 DIAGNOSIS — R002 Palpitations: Secondary | ICD-10-CM

## 2013-05-23 ENCOUNTER — Encounter (INDEPENDENT_AMBULATORY_CARE_PROVIDER_SITE_OTHER): Payer: Federal, State, Local not specified - PPO

## 2013-05-23 ENCOUNTER — Encounter: Payer: Self-pay | Admitting: *Deleted

## 2013-05-23 DIAGNOSIS — R002 Palpitations: Secondary | ICD-10-CM

## 2013-05-23 NOTE — Progress Notes (Unsigned)
Patient ID: Margaret Hughes, female   DOB: 1961/03/13, 53 y.o.   MRN: [id number] EVO 48 hour holter monitor applied to patient.

## 2014-01-03 DIAGNOSIS — G5602 Carpal tunnel syndrome, left upper limb: Secondary | ICD-10-CM

## 2014-01-03 DIAGNOSIS — M654 Radial styloid tenosynovitis [de Quervain]: Secondary | ICD-10-CM

## 2014-01-03 HISTORY — DX: Carpal tunnel syndrome, left upper limb: G56.02

## 2014-01-03 HISTORY — DX: Radial styloid tenosynovitis (de quervain): M65.4

## 2014-01-11 ENCOUNTER — Encounter (HOSPITAL_BASED_OUTPATIENT_CLINIC_OR_DEPARTMENT_OTHER): Payer: Self-pay | Admitting: *Deleted

## 2014-01-16 ENCOUNTER — Encounter (HOSPITAL_BASED_OUTPATIENT_CLINIC_OR_DEPARTMENT_OTHER): Payer: Self-pay | Admitting: Anesthesiology

## 2014-01-16 ENCOUNTER — Ambulatory Visit (HOSPITAL_BASED_OUTPATIENT_CLINIC_OR_DEPARTMENT_OTHER)
Admission: RE | Admit: 2014-01-16 | Payer: Worker's Compensation | Source: Ambulatory Visit | Admitting: Orthopedic Surgery

## 2014-01-16 SURGERY — CARPAL TUNNEL RELEASE
Anesthesia: Choice | Laterality: Left

## 2014-01-16 MED ORDER — PROPOFOL 10 MG/ML IV EMUL
INTRAVENOUS | Status: AC
  Filled 2014-01-16: qty 50

## 2014-01-16 MED ORDER — BUPIVACAINE HCL (PF) 0.5 % IJ SOLN
INTRAMUSCULAR | Status: AC
  Filled 2014-01-16: qty 30

## 2014-01-16 MED ORDER — FENTANYL CITRATE 0.05 MG/ML IJ SOLN
INTRAMUSCULAR | Status: AC
  Filled 2014-01-16: qty 6

## 2014-01-16 MED ORDER — MIDAZOLAM HCL 2 MG/2ML IJ SOLN
INTRAMUSCULAR | Status: AC
  Filled 2014-01-16: qty 2

## 2014-01-16 MED ORDER — PROPOFOL 10 MG/ML IV BOLUS
INTRAVENOUS | Status: AC
  Filled 2014-01-16: qty 60

## 2014-01-16 MED ORDER — BUPIVACAINE HCL (PF) 0.25 % IJ SOLN
INTRAMUSCULAR | Status: AC
  Filled 2014-01-16: qty 30

## 2014-01-17 ENCOUNTER — Other Ambulatory Visit: Payer: Self-pay | Admitting: Orthopedic Surgery

## 2014-01-24 ENCOUNTER — Encounter (HOSPITAL_BASED_OUTPATIENT_CLINIC_OR_DEPARTMENT_OTHER): Payer: Self-pay | Admitting: *Deleted

## 2014-01-30 ENCOUNTER — Ambulatory Visit (HOSPITAL_BASED_OUTPATIENT_CLINIC_OR_DEPARTMENT_OTHER): Admitting: Anesthesiology

## 2014-01-30 ENCOUNTER — Encounter (HOSPITAL_BASED_OUTPATIENT_CLINIC_OR_DEPARTMENT_OTHER): Admitting: Anesthesiology

## 2014-01-30 ENCOUNTER — Encounter (HOSPITAL_BASED_OUTPATIENT_CLINIC_OR_DEPARTMENT_OTHER): Payer: Self-pay | Admitting: *Deleted

## 2014-01-30 ENCOUNTER — Encounter (HOSPITAL_BASED_OUTPATIENT_CLINIC_OR_DEPARTMENT_OTHER)
Admission: RE | Disposition: A | Discharge status: Home / Self Care | Payer: Self-pay | Source: Ambulatory Visit | Attending: Orthopedic Surgery

## 2014-01-30 ENCOUNTER — Ambulatory Visit (HOSPITAL_BASED_OUTPATIENT_CLINIC_OR_DEPARTMENT_OTHER)
Admission: RE | Admit: 2014-01-30 | Discharge: 2014-01-30 | Disposition: A | Discharge status: Home / Self Care | Source: Ambulatory Visit | Attending: Orthopedic Surgery | Admitting: Orthopedic Surgery

## 2014-01-30 DIAGNOSIS — G5602 Carpal tunnel syndrome, left upper limb: Secondary | ICD-10-CM | POA: Diagnosis present

## 2014-01-30 DIAGNOSIS — Q638 Other specified congenital malformations of kidney: Secondary | ICD-10-CM | POA: Diagnosis not present

## 2014-01-30 DIAGNOSIS — E039 Hypothyroidism, unspecified: Secondary | ICD-10-CM | POA: Insufficient documentation

## 2014-01-30 DIAGNOSIS — M654 Radial styloid tenosynovitis [de Quervain]: Secondary | ICD-10-CM | POA: Diagnosis not present

## 2014-01-30 HISTORY — DX: Dental restoration status: Z98.811

## 2014-01-30 HISTORY — DX: Presence of dental prosthetic device (complete) (partial): Z97.2

## 2014-01-30 HISTORY — DX: Carpal tunnel syndrome, left upper limb: G56.02

## 2014-01-30 HISTORY — DX: Radial styloid tenosynovitis (de quervain): M65.4

## 2014-01-30 HISTORY — PX: DORSAL COMPARTMENT RELEASE: SHX5039

## 2014-01-30 HISTORY — PX: CARPAL TUNNEL RELEASE: SHX101

## 2014-01-30 HISTORY — DX: Congenital malformation of kidney, unspecified: Q63.9

## 2014-01-30 SURGERY — CARPAL TUNNEL RELEASE
Anesthesia: General | Site: Wrist | Laterality: Left

## 2014-01-30 MED ORDER — FENTANYL CITRATE 0.05 MG/ML IJ SOLN
INTRAMUSCULAR | Status: AC
  Filled 2014-01-30: qty 4

## 2014-01-30 MED ORDER — BUPIVACAINE HCL (PF) 0.5 % IJ SOLN
INTRAMUSCULAR | Status: AC
  Filled 2014-01-30: qty 30

## 2014-01-30 MED ORDER — FENTANYL CITRATE 0.05 MG/ML IJ SOLN
INTRAMUSCULAR | Status: DC | PRN
Start: 2014-01-30 — End: 2014-01-30
  Administered 2014-01-30: 100 ug via INTRAVENOUS
  Administered 2014-01-30: 50 ug via INTRAVENOUS

## 2014-01-30 MED ORDER — ONDANSETRON HCL 4 MG/2ML IJ SOLN: 4.0000 mg | Freq: Once | INTRAMUSCULAR | Status: DC | PRN

## 2014-01-30 MED ORDER — HYDROCODONE-ACETAMINOPHEN 5-325 MG PO TABS: 1.0000 | ORAL_TABLET | Freq: Four times a day (QID) | ORAL | Status: DC | PRN

## 2014-01-30 MED ORDER — FENTANYL CITRATE 0.05 MG/ML IJ SOLN: 50.0000 ug | INTRAMUSCULAR | Status: DC | PRN

## 2014-01-30 MED ORDER — PROPOFOL 10 MG/ML IV BOLUS
INTRAVENOUS | Status: AC
  Filled 2014-01-30: qty 20

## 2014-01-30 MED ORDER — ONDANSETRON HCL 4 MG/2ML IJ SOLN
INTRAMUSCULAR | Status: DC | PRN
  Administered 2014-01-30: 4 mg via INTRAVENOUS

## 2014-01-30 MED ORDER — DEXAMETHASONE SODIUM PHOSPHATE 10 MG/ML IJ SOLN
INTRAMUSCULAR | Status: DC | PRN
  Administered 2014-01-30: 10 mg via INTRAVENOUS

## 2014-01-30 MED ORDER — HYDROMORPHONE HCL 1 MG/ML IJ SOLN
INTRAMUSCULAR | Status: AC
  Filled 2014-01-30: qty 1

## 2014-01-30 MED ORDER — CEFAZOLIN SODIUM-DEXTROSE 2-3 GM-% IV SOLR
INTRAVENOUS | Status: AC
  Filled 2014-01-30: qty 50

## 2014-01-30 MED ORDER — HYDROMORPHONE HCL 1 MG/ML IJ SOLN
0.2500 mg | INTRAMUSCULAR | Status: DC | PRN
  Administered 2014-01-30: 0.5 mg via INTRAVENOUS
  Administered 2014-01-30: 0.25 mg via INTRAVENOUS

## 2014-01-30 MED ORDER — PROPOFOL 10 MG/ML IV BOLUS
INTRAVENOUS | Status: DC | PRN
  Administered 2014-01-30: 200 mg via INTRAVENOUS

## 2014-01-30 MED ORDER — MIDAZOLAM HCL 5 MG/5ML IJ SOLN
INTRAMUSCULAR | Status: DC | PRN
  Administered 2014-01-30: 2 mg via INTRAVENOUS

## 2014-01-30 MED ORDER — MIDAZOLAM HCL 2 MG/2ML IJ SOLN
INTRAMUSCULAR | Status: AC
  Filled 2014-01-30: qty 2

## 2014-01-30 MED ORDER — CHLORHEXIDINE GLUCONATE 4 % EX LIQD: 60.0000 mL | Freq: Once | CUTANEOUS | Status: DC

## 2014-01-30 MED ORDER — OXYCODONE HCL 5 MG/5ML PO SOLN: 5.0000 mg | Freq: Once | ORAL | Status: AC | PRN

## 2014-01-30 MED ORDER — MIDAZOLAM HCL 2 MG/2ML IJ SOLN: 1.0000 mg | INTRAMUSCULAR | Status: DC | PRN

## 2014-01-30 MED ORDER — LIDOCAINE HCL (CARDIAC) 20 MG/ML IV SOLN
INTRAVENOUS | Status: DC | PRN
  Administered 2014-01-30: 50 mg via INTRAVENOUS

## 2014-01-30 MED ORDER — OXYCODONE HCL 5 MG PO TABS
5.0000 mg | ORAL_TABLET | Freq: Once | ORAL | Status: AC | PRN
  Administered 2014-01-30: 5 mg via ORAL

## 2014-01-30 MED ORDER — OXYCODONE HCL 5 MG PO TABS
ORAL_TABLET | ORAL | Status: AC
  Filled 2014-01-30: qty 1

## 2014-01-30 MED ORDER — LACTATED RINGERS IV SOLN
INTRAVENOUS | Status: DC
  Administered 2014-01-30: 11:00:00 via INTRAVENOUS

## 2014-01-30 MED ORDER — CEFAZOLIN SODIUM-DEXTROSE 2-3 GM-% IV SOLR
2.0000 g | INTRAVENOUS | Status: AC
  Administered 2014-01-30: 2 g via INTRAVENOUS

## 2014-01-30 MED ORDER — BUPIVACAINE HCL (PF) 0.5 % IJ SOLN
INTRAMUSCULAR | Status: DC | PRN
  Administered 2014-01-30: 10 mL

## 2014-01-30 SURGICAL SUPPLY — 51 items
BANDAGE ELASTIC 3 VELCRO ST LF (GAUZE/BANDAGES/DRESSINGS) ×2 IMPLANT
BENZOIN TINCTURE PRP APPL 2/3 (GAUZE/BANDAGES/DRESSINGS) IMPLANT
BLADE SURG 15 STRL LF DISP TIS (BLADE) ×1 IMPLANT
BLADE SURG 15 STRL SS (BLADE) ×1
BNDG CONFORM 3 STRL LF (GAUZE/BANDAGES/DRESSINGS) ×2 IMPLANT
BNDG ELASTIC 2 VLCR STRL LF (GAUZE/BANDAGES/DRESSINGS) IMPLANT
BNDG ESMARK 4X9 LF (GAUZE/BANDAGES/DRESSINGS) ×2 IMPLANT
COVER BACK TABLE 60X90IN (DRAPES) ×2 IMPLANT
COVER MAYO STAND STRL (DRAPES) ×2 IMPLANT
CUFF TOURNIQUET SINGLE 18IN (TOURNIQUET CUFF) ×2 IMPLANT
DECANTER SPIKE VIAL GLASS SM (MISCELLANEOUS) IMPLANT
DRAPE EXTREMITY TIBURON (DRAPES) ×2 IMPLANT
DRAPE SURG 17X23 STRL (DRAPES) ×2 IMPLANT
DRSG EMULSION OIL 3X3 NADH (GAUZE/BANDAGES/DRESSINGS) ×2 IMPLANT
DURAPREP 26ML APPLICATOR (WOUND CARE) ×2 IMPLANT
ELECT NEEDLE TIP 2.8 STRL (NEEDLE) ×2 IMPLANT
ELECT REM PT RETURN 9FT ADLT (ELECTROSURGICAL) ×2
ELECTRODE REM PT RTRN 9FT ADLT (ELECTROSURGICAL) ×1 IMPLANT
GAUZE SPONGE 4X4 12PLY STRL (GAUZE/BANDAGES/DRESSINGS) ×2 IMPLANT
GLOVE BIOGEL PI IND STRL 7.0 (GLOVE) ×1 IMPLANT
GLOVE BIOGEL PI IND STRL 8 (GLOVE) ×2 IMPLANT
GLOVE BIOGEL PI INDICATOR 7.0 (GLOVE) ×1
GLOVE BIOGEL PI INDICATOR 8 (GLOVE) ×2
GLOVE ECLIPSE 6.5 STRL STRAW (GLOVE) ×2 IMPLANT
GLOVE ECLIPSE 7.5 STRL STRAW (GLOVE) ×4 IMPLANT
GOWN STRL REUS W/ TWL LRG LVL3 (GOWN DISPOSABLE) ×1 IMPLANT
GOWN STRL REUS W/ TWL XL LVL3 (GOWN DISPOSABLE) ×1 IMPLANT
GOWN STRL REUS W/TWL LRG LVL3 (GOWN DISPOSABLE) ×1
GOWN STRL REUS W/TWL XL LVL3 (GOWN DISPOSABLE) ×3 IMPLANT
NEEDLE HYPO 25X1 1.5 SAFETY (NEEDLE) ×2 IMPLANT
NS IRRIG 1000ML POUR BTL (IV SOLUTION) ×2 IMPLANT
PACK BASIN DAY SURGERY FS (CUSTOM PROCEDURE TRAY) ×2 IMPLANT
PAD CAST 3X4 CTTN HI CHSV (CAST SUPPLIES) ×1 IMPLANT
PADDING CAST ABS 4INX4YD NS (CAST SUPPLIES)
PADDING CAST ABS COTTON 4X4 ST (CAST SUPPLIES) IMPLANT
PADDING CAST COTTON 3X4 STRL (CAST SUPPLIES) ×1
PADDING UNDERCAST 2 STRL (CAST SUPPLIES)
PADDING UNDERCAST 2X4 STRL (CAST SUPPLIES) IMPLANT
PENCIL BUTTON HOLSTER BLD 10FT (ELECTRODE) ×2 IMPLANT
SPLINT PLASTER CAST XFAST 3X15 (CAST SUPPLIES) ×5 IMPLANT
SPLINT PLASTER XTRA FASTSET 3X (CAST SUPPLIES) ×5
SPONGE GAUZE 4X4 12PLY STER LF (GAUZE/BANDAGES/DRESSINGS) ×2 IMPLANT
STOCKINETTE 4X48 STRL (DRAPES) ×2 IMPLANT
STRIP CLOSURE SKIN 1/2X4 (GAUZE/BANDAGES/DRESSINGS) IMPLANT
SUT ETHILON 4 0 PS 2 18 (SUTURE) ×2 IMPLANT
SUT MNCRL AB 4-0 PS2 18 (SUTURE) IMPLANT
SYR BULB 3OZ (MISCELLANEOUS) ×2 IMPLANT
SYR CONTROL 10ML LL (SYRINGE) ×2 IMPLANT
TOWEL OR 17X24 6PK STRL BLUE (TOWEL DISPOSABLE) ×2 IMPLANT
TOWEL OR NON WOVEN STRL DISP B (DISPOSABLE) ×2 IMPLANT
UNDERPAD 30X30 INCONTINENT (UNDERPADS AND DIAPERS) ×2 IMPLANT

## 2014-01-30 NOTE — Anesthesia Preprocedure Evaluation (Signed)
Anesthesia Evaluation  Patient identified by MRN, date of birth, ID band Patient awake    Reviewed: Allergy & Precautions, H&P , NPO status , Patient's Chart, lab work & pertinent test results  Airway Mallampati: I  TM Distance: >3 FB Neck ROM: Full    Dental  (+) Teeth Intact, Dental Advisory Given   Pulmonary  breath sounds clear to auscultation        Cardiovascular Rhythm:Regular     Neuro/Psych    GI/Hepatic   Endo/Other    Renal/GU      Musculoskeletal   Abdominal   Peds  Hematology   Anesthesia Other Findings   Reproductive/Obstetrics                             Anesthesia Physical Anesthesia Plan  ASA: II  Anesthesia Plan: General   Post-op Pain Management:    Induction: Intravenous  Airway Management Planned: LMA  Additional Equipment:   Intra-op Plan:   Post-operative Plan: Extubation in OR  Informed Consent: I have reviewed the patients History and Physical, chart, labs and discussed the procedure including the risks, benefits and alternatives for the proposed anesthesia with the patient or authorized representative who has indicated his/her understanding and acceptance.   Dental advisory given  Plan Discussed with: CRNA, Anesthesiologist and Surgeon  Anesthesia Plan Comments:         Anesthesia Quick Evaluation

## 2014-01-30 NOTE — H&P (Addendum)
PREOPERATIVE H&P  Chief Complaint: left hand numbness and tingling along with painful use of the thumb  HPI: Margaret Hughes is a 53 y.o. female who presents for evaluation of left hand numbness and tingling along with painful use of the thumb. It has been present for greater than 3 months and has been worsening. She has failed conservative measures. Pain is rated as moderate.  Past Medical History  Diagnosis Date  . Hypothyroidism   . 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 Carpal tunnel syndrome of left wrist 01/2014  . De Quervain's tenosynovitis, left 01/2014  . Dental crowns present   . Dental bridge present     upper   Past Surgical History  Procedure Laterality Date  . Knee arthroscopy Right   . Pilonidal cyst / sinus excision  1998  . Wisdom tooth extraction  2008  . Shoulder arthroscopy Right 2014  . Robotic assisted total hysterectomy  10/19/2011  . Cystoscopy with ureteroscopy and stent placement Bilateral 10/19/2011   History   Social History  . Marital Status: Married    Spouse Name: N/A    Number of Children: N/A  . Years of Education: N/A   Occupational History  . IT specialist     [employer]   Social History Main Topics  . Smoking status: Never Smoker   . Smokeless tobacco: Never Used  . Alcohol Use: No  . Drug Use: No  . Sexual Activity: Yes    Birth Control/ Protection: Surgical   Other Topics Concern  . None   Social History Narrative  . None   Family History  Problem Relation Age of Onset  . Asthma Father   . Emphysema Father   . Cancer Father   . Sickle cell trait Mother   . Thyroid disease Mother   . Diabetes Mother   . Hypertension Mother   . Kidney failure Sister   . Migraines Sister   . Kidney disease Sister   . Thyroid disease Sister    Allergies  Allergen Reactions  . Contrast Media [Iodinated Diagnostic Agents] Swelling    FACIAL SWELLING  . Ibuprofen Other (See  Comments)    STOMACH IRRITATION  . Adhesive [Tape] Other (See Comments)    SKIN IRRITATION  . Tramadol Itching   Prior to Admission medications   Medication Sig Start Date End Date Taking? Authorizing Provider  estradiol (VIVELLE-DOT) 0.1 MG/24HR patch Place 1 patch onto the skin 2 (two) times a week.   Yes Historical Provider, MD  HYDROcodone-acetaminophen (NORCO/VICODIN) 5-325 MG per tablet Take 1 tablet by mouth every 6 (six) hours as needed for moderate pain.   Yes Historical Provider, MD  levothyroxine (SYNTHROID, LEVOTHROID) 125 MCG tablet Take 110 mcg by mouth daily.    Yes Historical Provider, MD  Multiple Vitamin (MULTIVITAMIN) tablet Take 1 tablet by mouth daily.   Yes Historical Provider, MD     Positive ROS: none  All other systems have been reviewed and were otherwise negative with the exception of those mentioned in the HPI and as above.  Physical Exam: Filed Vitals:   01/30/14 1037  BP: 123/65  Pulse: 56  Temp: 97.8 F (36.6 C)  Resp: 16    General: Alert, no acute distress Cardiovascular: No pedal edema Respiratory: No cyanosis, no use of accessory musculature GI: No organomegaly, abdomen is soft and non-tender Skin: No lesions in the area of chief complaint Neurologic: Sensation  intact distally Psychiatric: Patient is competent for consent with normal mood and affect Lymphatic: No axillary or cervical lymphadenopathy  MUSCULOSKELETAL: left hand: Positive Phalen's, positive Tinel's.  Negative thenar atrophy.  Positive Finkelstein's test and positive pain with range of motion of the left thumb  EMG: EMG positive for carpal tunnel syndrome left  Assessment/Plan: left side carpal tunnel syndrome, dequerveins tendonitis Plan for Procedure(s): CARPAL TUNNEL RELEASE RELEASE DORSAL COMPARTMENT (DEQUERVAIN)  The risks benefits and alternatives were discussed with the patient including but not limited to the risks of nonoperative treatment, versus surgical  intervention including infection, bleeding, nerve injury, malunion, nonunion, hardware prominence, hardware failure, need for hardware removal, blood clots, cardiopulmonary complications, morbidity, mortality, among others, and they were willing to proceed.  Predicted outcome is good, although there will be at least a six to nine month expected recovery.  Brittnae Aschenbrenner L, MD 01/30/2014 11:27 AM

## 2014-01-30 NOTE — Anesthesia Postprocedure Evaluation (Signed)
  Anesthesia Post-op Note  Patient: Margaret Hughes  Procedure(s) Performed: Procedure(s): CARPAL TUNNEL RELEASE (Left) RELEASE DORSAL COMPARTMENT (DEQUERVAIN) (Left)  Patient Location: PACU  Anesthesia Type: General   Level of Consciousness: awake, alert  and oriented  Airway and Oxygen Therapy: Patient Spontanous Breathing  Post-op Pain: moderate  Post-op Assessment: Post-op Vital signs reviewed  Post-op Vital Signs: Reviewed  Last Vitals:  Filed Vitals:   01/30/14 1400  BP: 151/90  Pulse: 60  Temp: 36.6 C  Resp: 16    Complications: No apparent anesthesia complications

## 2014-01-30 NOTE — Discharge Instructions (Signed)
Elevate your hand as much as possible. Apply ice for a couple of days. You may move your fingers as tolerated. Use hydrocodone as needed for pain.    Post Anesthesia Home Care Instructions  Activity: Get plenty of rest for the remainder of the day. A responsible adult should stay with you for 24 hours following the procedure.  For the next 24 hours, DO NOT: -Drive a car -Paediatric nurse -Drink alcoholic beverages -Take any medication unless instructed by your physician -Make any legal decisions or sign important papers.  Meals: Start with liquid foods such as gelatin or soup. Progress to regular foods as tolerated. Avoid greasy, spicy, heavy foods. If nausea and/or vomiting occur, drink only clear liquids until the nausea and/or vomiting subsides. Call your physician if vomiting continues.  Special Instructions/Symptoms: Your throat may feel dry or sore from the anesthesia or the breathing tube placed in your throat during surgery. If this causes discomfort, gargle with warm salt water. The discomfort should disappear within 24 hours.

## 2014-01-30 NOTE — Brief Op Note (Signed)
01/30/2014  11:56 AM  PATIENT:  Margaret Hughes  53 y.o. female  PRE-OPERATIVE DIAGNOSIS:  left side carpal tunnel syndrome, dequerveins tendonitis  POST-OPERATIVE DIAGNOSIS:  Left Carpal tunnel Syndrome and Dequervains Tendonitits  PROCEDURE:  Procedure(s): CARPAL TUNNEL RELEASE (Left) RELEASE DORSAL COMPARTMENT (DEQUERVAIN) (Left)  SURGEON:  Surgeon(s) and Role:    * Alta Corning, MD - Primary  PHYSICIAN ASSISTANT:   ASSISTANTS: bethune   ANESTHESIA:   general  EBL:  Total I/O In: 300 [I.V.:300] Out: -   BLOOD ADMINISTERED:none  DRAINS: none   LOCAL MEDICATIONS USED:  MARCAINE     SPECIMEN:  No Specimen  DISPOSITION OF SPECIMEN:  N/A  COUNTS:  YES  TOURNIQUET:  * Missing tourniquet times found for documented tourniquets in log:  [id number] *  DICTATION: .Other Dictation: Dictation Number [id number]  PLAN OF CARE: Discharge to home after PACU  PATIENT DISPOSITION:  PACU - hemodynamically stable.   Delay start of Pharmacological VTE agent (>24hrs) due to surgical blood loss or risk of bleeding: no

## 2014-01-30 NOTE — Transfer of Care (Signed)
Immediate Anesthesia Transfer of Care Note  Patient: Margaret Hughes  Procedure(s) Performed: Procedure(s): CARPAL TUNNEL RELEASE (Left) RELEASE DORSAL COMPARTMENT (DEQUERVAIN) (Left)  Patient Location: PACU  Anesthesia Type:General  Level of Consciousness: awake  Airway & Oxygen Therapy: Patient Spontanous Breathing and Patient connected to face mask oxygen  Post-op Assessment: Report given to PACU RN and Post -op Vital signs reviewed and stable  Post vital signs: Reviewed and stable  Complications: No apparent anesthesia complications

## 2014-01-30 NOTE — Anesthesia Procedure Notes (Signed)
Procedure Name: LMA Insertion Date/Time: 01/30/2014 11:35 AM Performed by: Lieutenant Diego Pre-anesthesia Checklist: Patient identified, Emergency Drugs available, Suction available and Patient being monitored Patient Re-evaluated:Patient Re-evaluated prior to inductionOxygen Delivery Method: Circle System Utilized Preoxygenation: Pre-oxygenation with 100% oxygen Intubation Type: IV induction Ventilation: Mask ventilation without difficulty LMA: LMA inserted LMA Size: 4.0 Number of attempts: 1 Airway Equipment and Method: bite block Placement Confirmation: positive ETCO2 and breath sounds checked- equal and bilateral Tube secured with: Tape Dental Injury: Teeth and Oropharynx as per pre-operative assessment

## 2014-01-31 ENCOUNTER — Encounter (HOSPITAL_BASED_OUTPATIENT_CLINIC_OR_DEPARTMENT_OTHER): Payer: Self-pay | Admitting: Orthopedic Surgery

## 2014-01-31 NOTE — Op Note (Signed)
NAMEADESUWA, OSGOOD              ACCOUNT NO.:  [id number]  MEDICAL RECORD NO.:  [id number]  LOCATION:                                 FACILITY:  PHYSICIAN:  Alta Corning, M.D.   DATE OF BIRTH:  04-24-1960  DATE OF PROCEDURE:  01/30/2014 DATE OF DISCHARGE:  01/30/2014                              OPERATIVE REPORT   PREOPERATIVE DIAGNOSES: 1. De Quervain's tenosynovitis, left. 2. Carpal tunnel syndrome, left.  POSTOPERATIVE DIAGNOSES: 1. De Quervain's tenosynovitis, left. 2. Carpal tunnel syndrome, left.  PRINCIPAL PROCEDURES: 1. De Quervain's release, left. 2. Carpal tunnel release, left.  BRIEF HISTORY:  Mrs. Domke is a 53 year old female with long history of having significant complaints of left upper extremity pain around the thumb.  We noted her to have De Quervain's tenosynovitis, she improved with injection therapy.  She also was having some numbness and tingling in her hand.  An EMG was performed, which showed that she had left carpal tunnel syndrome.  We talked about treatment options, but at that point, felt that surgical intervention for carpal tunnel release and De Quervain's release would be appropriate, and she was brought to the operating room for this procedure.  DESCRIPTION OF PROCEDURE:  The patient was brought to the operating room, after adequate anesthesia was obtained with general anesthetic, the patient was placed supine on the operating table.  Left arm was prepped and draped in usual sterile fashion.  Following this, the arm was exsanguinated, blood pressure tourniquet was inflated to 250 mmHg. Following this, a curved incision was made just over the first extensor compartment.  Attention was turned down to the first extensor compartment where the tendons were identified and the sheaths were opened in a proximal distal direction.  There was an accessory sheath, which we identified and opened and then we looked for additional accessory sheath.   Finding down, there were four tendons, which had been freed up quite a lot on that first extensor compartment.  This wound was irrigated, suctioned dry and closed with Monocryl subcuticular underneath the skin.  At this point, attention was turned to the volar hand where an incision was made just ulnar to the midline wrist crease subcutaneous tissue down the level of the volar carpal ligament, which was identified.  A small rent was made, a free elevator was used to make sure that the nerve was not adherent on the undersurface and then the scissors were used to release the carpal ligament under direct vision. Once this was done, the gloved finger could be placed in the wound proximally and distally.  At this point, the wound was irrigated, suctioned dry, closed with interrupted and running nylon suture.  Sterile compressive dressing was applied as well as volar plaster and the patient was taken to the recovery, she was noted to be in satisfactory condition.  Estimated blood loss for the procedure was none.     Alta Corning, M.D.     Corliss Skains  D:  01/30/2014  T:  01/30/2014  Job:  [id number]

## 2014-02-04 ENCOUNTER — Encounter (HOSPITAL_BASED_OUTPATIENT_CLINIC_OR_DEPARTMENT_OTHER): Payer: Self-pay | Admitting: Orthopedic Surgery

## 2015-07-14 DIAGNOSIS — M5441 Lumbago with sciatica, right side: Secondary | ICD-10-CM | POA: Diagnosis not present

## 2015-07-21 ENCOUNTER — Ambulatory Visit
Admission: RE | Admit: 2015-07-21 | Discharge: 2015-07-21 | Disposition: A | Discharge status: Home / Self Care | Payer: Federal, State, Local not specified - PPO | Source: Ambulatory Visit | Attending: Internal Medicine | Admitting: Internal Medicine

## 2015-07-21 ENCOUNTER — Other Ambulatory Visit: Payer: Self-pay | Admitting: Internal Medicine

## 2015-07-21 DIAGNOSIS — M5441 Lumbago with sciatica, right side: Secondary | ICD-10-CM

## 2015-07-21 DIAGNOSIS — M545 Low back pain: Secondary | ICD-10-CM | POA: Diagnosis not present

## 2015-07-23 ENCOUNTER — Other Ambulatory Visit: Payer: Self-pay | Admitting: Internal Medicine

## 2015-07-23 DIAGNOSIS — M5430 Sciatica, unspecified side: Secondary | ICD-10-CM

## 2015-07-27 ENCOUNTER — Ambulatory Visit
Admission: RE | Admit: 2015-07-27 | Discharge: 2015-07-27 | Disposition: A | Discharge status: Home / Self Care | Payer: Federal, State, Local not specified - PPO | Source: Ambulatory Visit | Attending: Internal Medicine | Admitting: Internal Medicine

## 2015-07-27 DIAGNOSIS — M5430 Sciatica, unspecified side: Secondary | ICD-10-CM

## 2015-07-27 DIAGNOSIS — M5126 Other intervertebral disc displacement, lumbar region: Secondary | ICD-10-CM | POA: Diagnosis not present

## 2015-08-18 DIAGNOSIS — M25561 Pain in right knee: Secondary | ICD-10-CM | POA: Diagnosis not present

## 2015-08-20 DIAGNOSIS — M545 Low back pain: Secondary | ICD-10-CM | POA: Diagnosis not present

## 2015-08-20 DIAGNOSIS — M5416 Radiculopathy, lumbar region: Secondary | ICD-10-CM | POA: Diagnosis not present

## 2015-08-22 DIAGNOSIS — T7840XA Allergy, unspecified, initial encounter: Secondary | ICD-10-CM | POA: Diagnosis not present

## 2015-08-26 DIAGNOSIS — M545 Low back pain: Secondary | ICD-10-CM | POA: Diagnosis not present

## 2015-08-26 DIAGNOSIS — M5416 Radiculopathy, lumbar region: Secondary | ICD-10-CM | POA: Diagnosis not present

## 2015-08-27 DIAGNOSIS — M545 Low back pain: Secondary | ICD-10-CM | POA: Diagnosis not present

## 2015-08-27 DIAGNOSIS — M5416 Radiculopathy, lumbar region: Secondary | ICD-10-CM | POA: Diagnosis not present

## 2015-09-02 DIAGNOSIS — M545 Low back pain: Secondary | ICD-10-CM | POA: Diagnosis not present

## 2015-09-02 DIAGNOSIS — M5416 Radiculopathy, lumbar region: Secondary | ICD-10-CM | POA: Diagnosis not present

## 2015-09-04 DIAGNOSIS — M5416 Radiculopathy, lumbar region: Secondary | ICD-10-CM | POA: Diagnosis not present

## 2015-09-04 DIAGNOSIS — M545 Low back pain: Secondary | ICD-10-CM | POA: Diagnosis not present

## 2015-09-09 DIAGNOSIS — M545 Low back pain: Secondary | ICD-10-CM | POA: Diagnosis not present

## 2015-09-09 DIAGNOSIS — M5416 Radiculopathy, lumbar region: Secondary | ICD-10-CM | POA: Diagnosis not present

## 2015-09-10 DIAGNOSIS — M545 Low back pain: Secondary | ICD-10-CM | POA: Diagnosis not present

## 2015-09-10 DIAGNOSIS — M5416 Radiculopathy, lumbar region: Secondary | ICD-10-CM | POA: Diagnosis not present

## 2015-09-24 DIAGNOSIS — K080 Exfoliation of teeth due to systemic causes: Secondary | ICD-10-CM | POA: Diagnosis not present

## 2015-09-29 DIAGNOSIS — M5442 Lumbago with sciatica, left side: Secondary | ICD-10-CM | POA: Diagnosis not present

## 2015-09-29 DIAGNOSIS — M545 Low back pain: Secondary | ICD-10-CM | POA: Diagnosis not present

## 2015-09-29 DIAGNOSIS — M5441 Lumbago with sciatica, right side: Secondary | ICD-10-CM | POA: Diagnosis not present

## 2015-10-06 DIAGNOSIS — Z1231 Encounter for screening mammogram for malignant neoplasm of breast: Secondary | ICD-10-CM | POA: Diagnosis not present

## 2015-11-13 ENCOUNTER — Other Ambulatory Visit: Payer: Self-pay | Admitting: Internal Medicine

## 2015-11-13 ENCOUNTER — Ambulatory Visit
Admission: RE | Admit: 2015-11-13 | Discharge: 2015-11-13 | Disposition: A | Discharge status: Home / Self Care | Payer: Federal, State, Local not specified - PPO | Source: Ambulatory Visit | Attending: Internal Medicine | Admitting: Internal Medicine

## 2015-11-13 DIAGNOSIS — S93402A Sprain of unspecified ligament of left ankle, initial encounter: Secondary | ICD-10-CM

## 2015-11-13 DIAGNOSIS — M25572 Pain in left ankle and joints of left foot: Secondary | ICD-10-CM | POA: Diagnosis not present

## 2015-11-13 DIAGNOSIS — M7989 Other specified soft tissue disorders: Secondary | ICD-10-CM | POA: Diagnosis not present

## 2015-12-22 DIAGNOSIS — Z23 Encounter for immunization: Secondary | ICD-10-CM | POA: Diagnosis not present

## 2015-12-22 DIAGNOSIS — Z0000 Encounter for general adult medical examination without abnormal findings: Secondary | ICD-10-CM | POA: Diagnosis not present

## 2016-02-02 DIAGNOSIS — E0500 Thyrotoxicosis with diffuse goiter without thyrotoxic crisis or storm: Secondary | ICD-10-CM | POA: Diagnosis not present

## 2016-02-10 DIAGNOSIS — M79644 Pain in right finger(s): Secondary | ICD-10-CM | POA: Diagnosis not present

## 2016-02-17 DIAGNOSIS — N951 Menopausal and female climacteric states: Secondary | ICD-10-CM | POA: Diagnosis not present

## 2016-02-17 DIAGNOSIS — Z6833 Body mass index (BMI) 33.0-33.9, adult: Secondary | ICD-10-CM | POA: Diagnosis not present

## 2016-02-17 DIAGNOSIS — M654 Radial styloid tenosynovitis [de Quervain]: Secondary | ICD-10-CM | POA: Diagnosis not present

## 2016-02-17 DIAGNOSIS — G5601 Carpal tunnel syndrome, right upper limb: Secondary | ICD-10-CM | POA: Diagnosis not present

## 2016-02-17 DIAGNOSIS — M545 Low back pain: Secondary | ICD-10-CM | POA: Diagnosis not present

## 2016-02-17 DIAGNOSIS — Z01419 Encounter for gynecological examination (general) (routine) without abnormal findings: Secondary | ICD-10-CM | POA: Diagnosis not present

## 2016-03-01 DIAGNOSIS — E0500 Thyrotoxicosis with diffuse goiter without thyrotoxic crisis or storm: Secondary | ICD-10-CM | POA: Diagnosis not present

## 2016-03-11 DIAGNOSIS — G5601 Carpal tunnel syndrome, right upper limb: Secondary | ICD-10-CM | POA: Diagnosis not present

## 2016-03-11 DIAGNOSIS — M654 Radial styloid tenosynovitis [de Quervain]: Secondary | ICD-10-CM | POA: Diagnosis not present

## 2016-03-12 DIAGNOSIS — R200 Anesthesia of skin: Secondary | ICD-10-CM | POA: Diagnosis not present

## 2016-04-06 DIAGNOSIS — J069 Acute upper respiratory infection, unspecified: Secondary | ICD-10-CM | POA: Diagnosis not present

## 2016-04-07 DIAGNOSIS — R200 Anesthesia of skin: Secondary | ICD-10-CM | POA: Diagnosis not present

## 2016-04-15 DIAGNOSIS — R0681 Apnea, not elsewhere classified: Secondary | ICD-10-CM | POA: Diagnosis not present

## 2016-04-15 DIAGNOSIS — G4719 Other hypersomnia: Secondary | ICD-10-CM | POA: Diagnosis not present

## 2016-04-20 DIAGNOSIS — G5601 Carpal tunnel syndrome, right upper limb: Secondary | ICD-10-CM | POA: Diagnosis not present

## 2016-04-20 DIAGNOSIS — M654 Radial styloid tenosynovitis [de Quervain]: Secondary | ICD-10-CM | POA: Diagnosis not present

## 2016-05-07 DIAGNOSIS — K080 Exfoliation of teeth due to systemic causes: Secondary | ICD-10-CM | POA: Diagnosis not present

## 2016-05-13 DIAGNOSIS — G5601 Carpal tunnel syndrome, right upper limb: Secondary | ICD-10-CM | POA: Diagnosis not present

## 2016-05-13 DIAGNOSIS — M654 Radial styloid tenosynovitis [de Quervain]: Secondary | ICD-10-CM | POA: Diagnosis not present

## 2016-10-08 DIAGNOSIS — Z1231 Encounter for screening mammogram for malignant neoplasm of breast: Secondary | ICD-10-CM | POA: Diagnosis not present

## 2016-12-14 DIAGNOSIS — K080 Exfoliation of teeth due to systemic causes: Secondary | ICD-10-CM | POA: Diagnosis not present

## 2016-12-27 DIAGNOSIS — Z23 Encounter for immunization: Secondary | ICD-10-CM | POA: Diagnosis not present

## 2016-12-27 DIAGNOSIS — Z0000 Encounter for general adult medical examination without abnormal findings: Secondary | ICD-10-CM | POA: Diagnosis not present

## 2017-01-18 DIAGNOSIS — Z01419 Encounter for gynecological examination (general) (routine) without abnormal findings: Secondary | ICD-10-CM | POA: Diagnosis not present

## 2017-02-11 ENCOUNTER — Other Ambulatory Visit: Payer: Self-pay | Admitting: Orthopedic Surgery

## 2017-02-11 DIAGNOSIS — M25519 Pain in unspecified shoulder: Secondary | ICD-10-CM

## 2017-03-01 ENCOUNTER — Ambulatory Visit
Admission: RE | Admit: 2017-03-01 | Discharge: 2017-03-01 | Disposition: A | Discharge status: Home / Self Care | Payer: Worker's Compensation | Source: Ambulatory Visit | Attending: Orthopedic Surgery | Admitting: Orthopedic Surgery

## 2017-03-01 DIAGNOSIS — M25519 Pain in unspecified shoulder: Secondary | ICD-10-CM

## 2017-03-04 ENCOUNTER — Inpatient Hospital Stay
Admission: RE | Admit: 2017-03-04 | Discharge: 2017-03-04 | Disposition: A | Discharge status: Home / Self Care | Payer: Federal, State, Local not specified - PPO | Source: Ambulatory Visit | Attending: Orthopedic Surgery | Admitting: Orthopedic Surgery

## 2017-03-21 DIAGNOSIS — E2839 Other primary ovarian failure: Secondary | ICD-10-CM | POA: Diagnosis not present

## 2017-03-21 DIAGNOSIS — Z13820 Encounter for screening for osteoporosis: Secondary | ICD-10-CM | POA: Diagnosis not present

## 2017-06-24 DIAGNOSIS — R5383 Other fatigue: Secondary | ICD-10-CM | POA: Diagnosis not present

## 2017-06-24 DIAGNOSIS — R42 Dizziness and giddiness: Secondary | ICD-10-CM | POA: Diagnosis not present

## 2017-06-24 DIAGNOSIS — R0981 Nasal congestion: Secondary | ICD-10-CM | POA: Diagnosis not present

## 2017-07-14 IMAGING — CR DG LUMBAR SPINE 2-3V
3 series · 3 of 3 positions shown · non-contrast
Comparison: Coronal and sagittal reconstructed images through the
lumbar spine from a CT scan of the abdomen and pelvis August 23, 2011

CLINICAL DATA: Low back and right leg pain for the past month with
no known injury.

EXAM:
LUMBAR SPINE - 2-3 VIEW

[t l-spine a.p.]
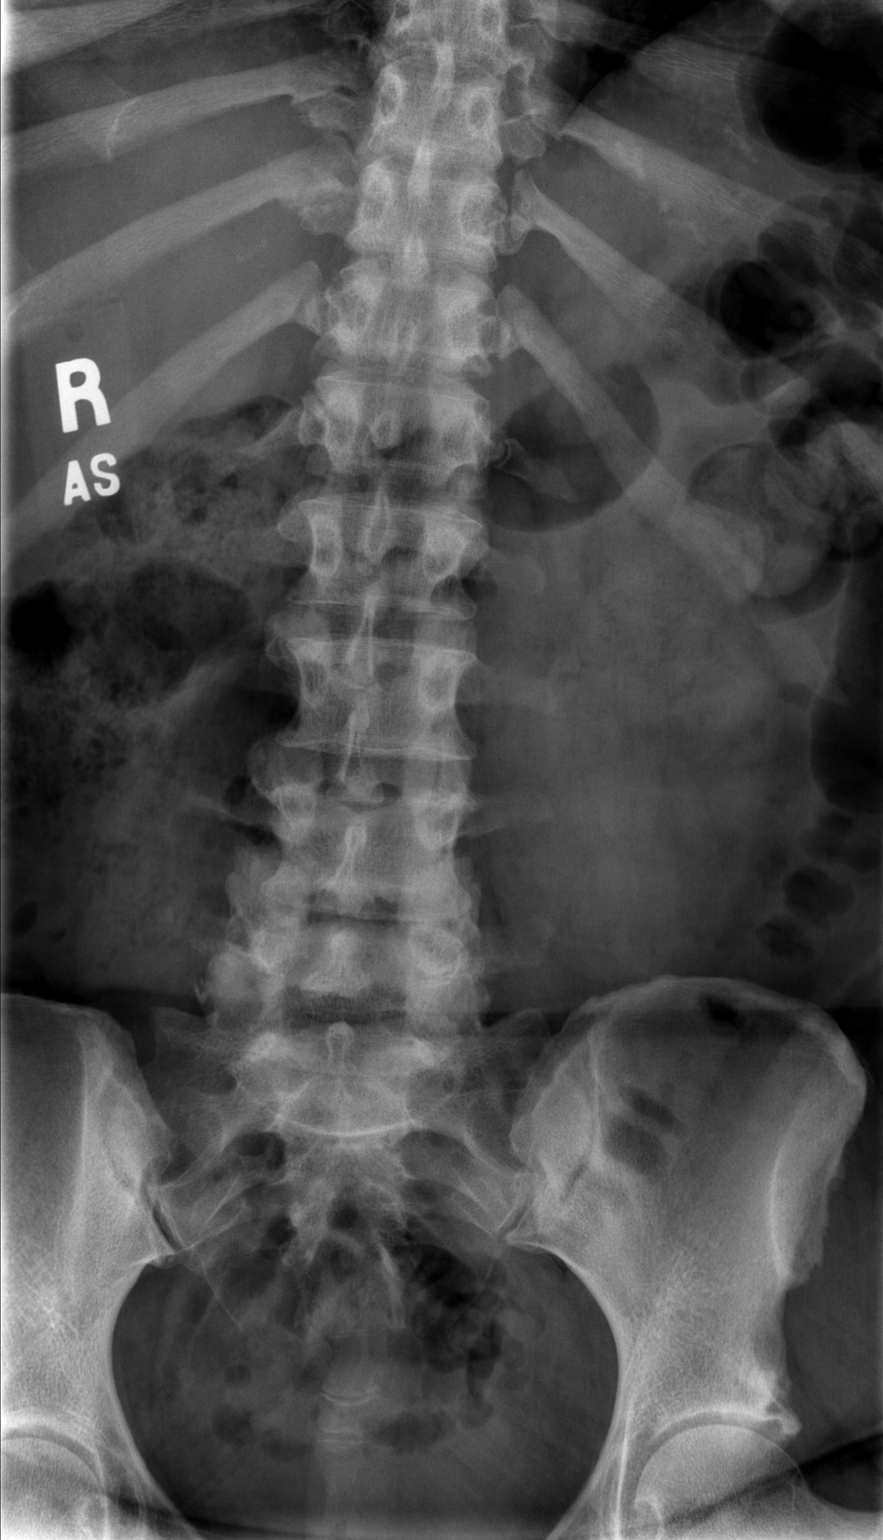

[t l-spine lat]
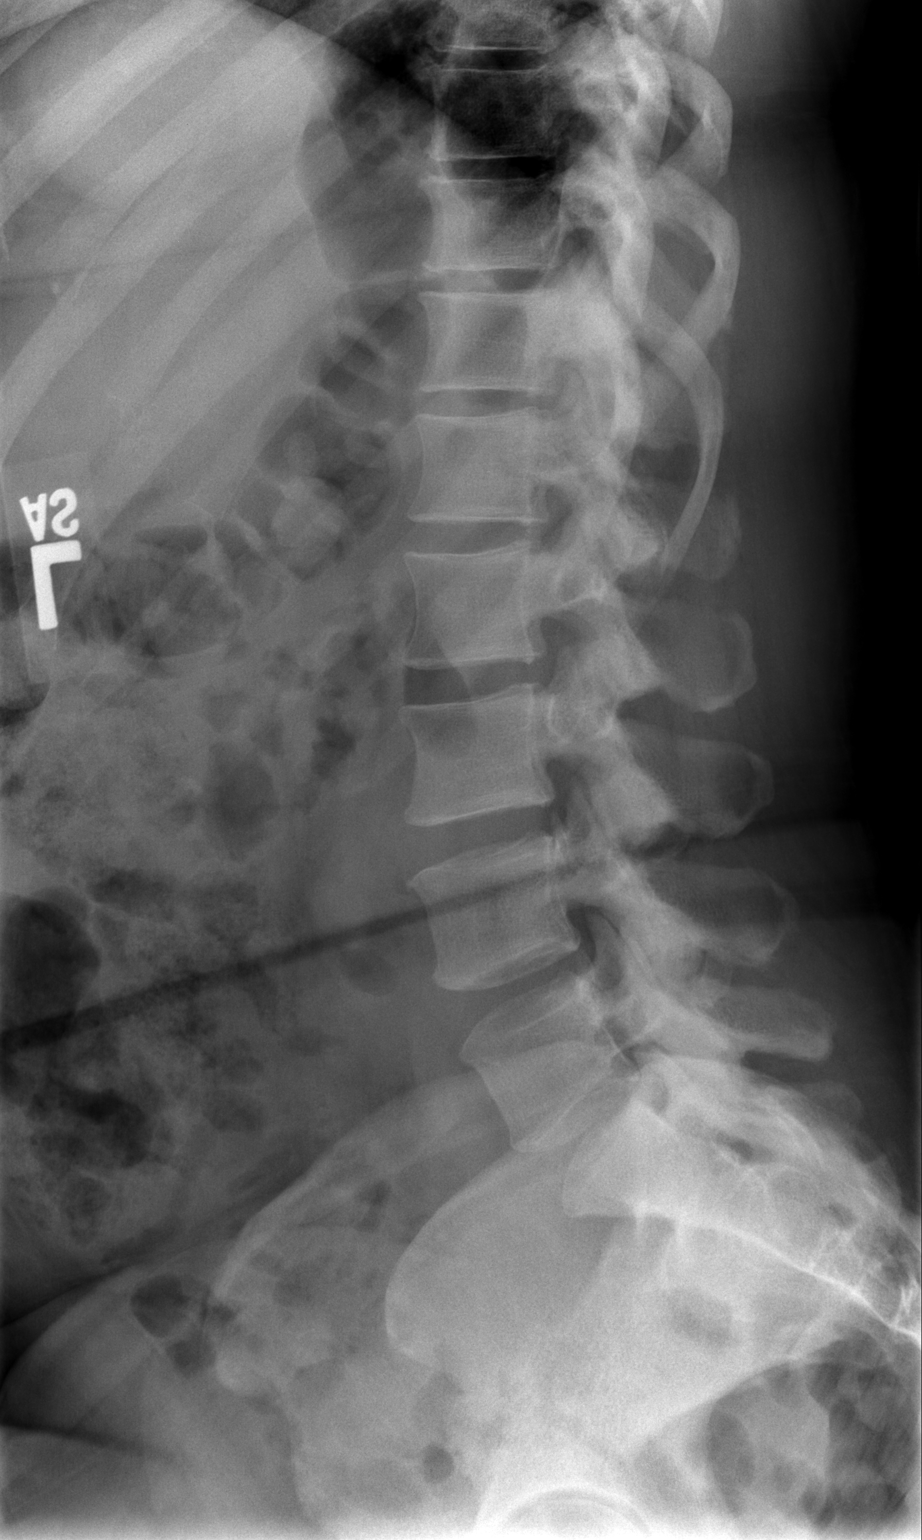

[t l-spine l5-s1 spot]
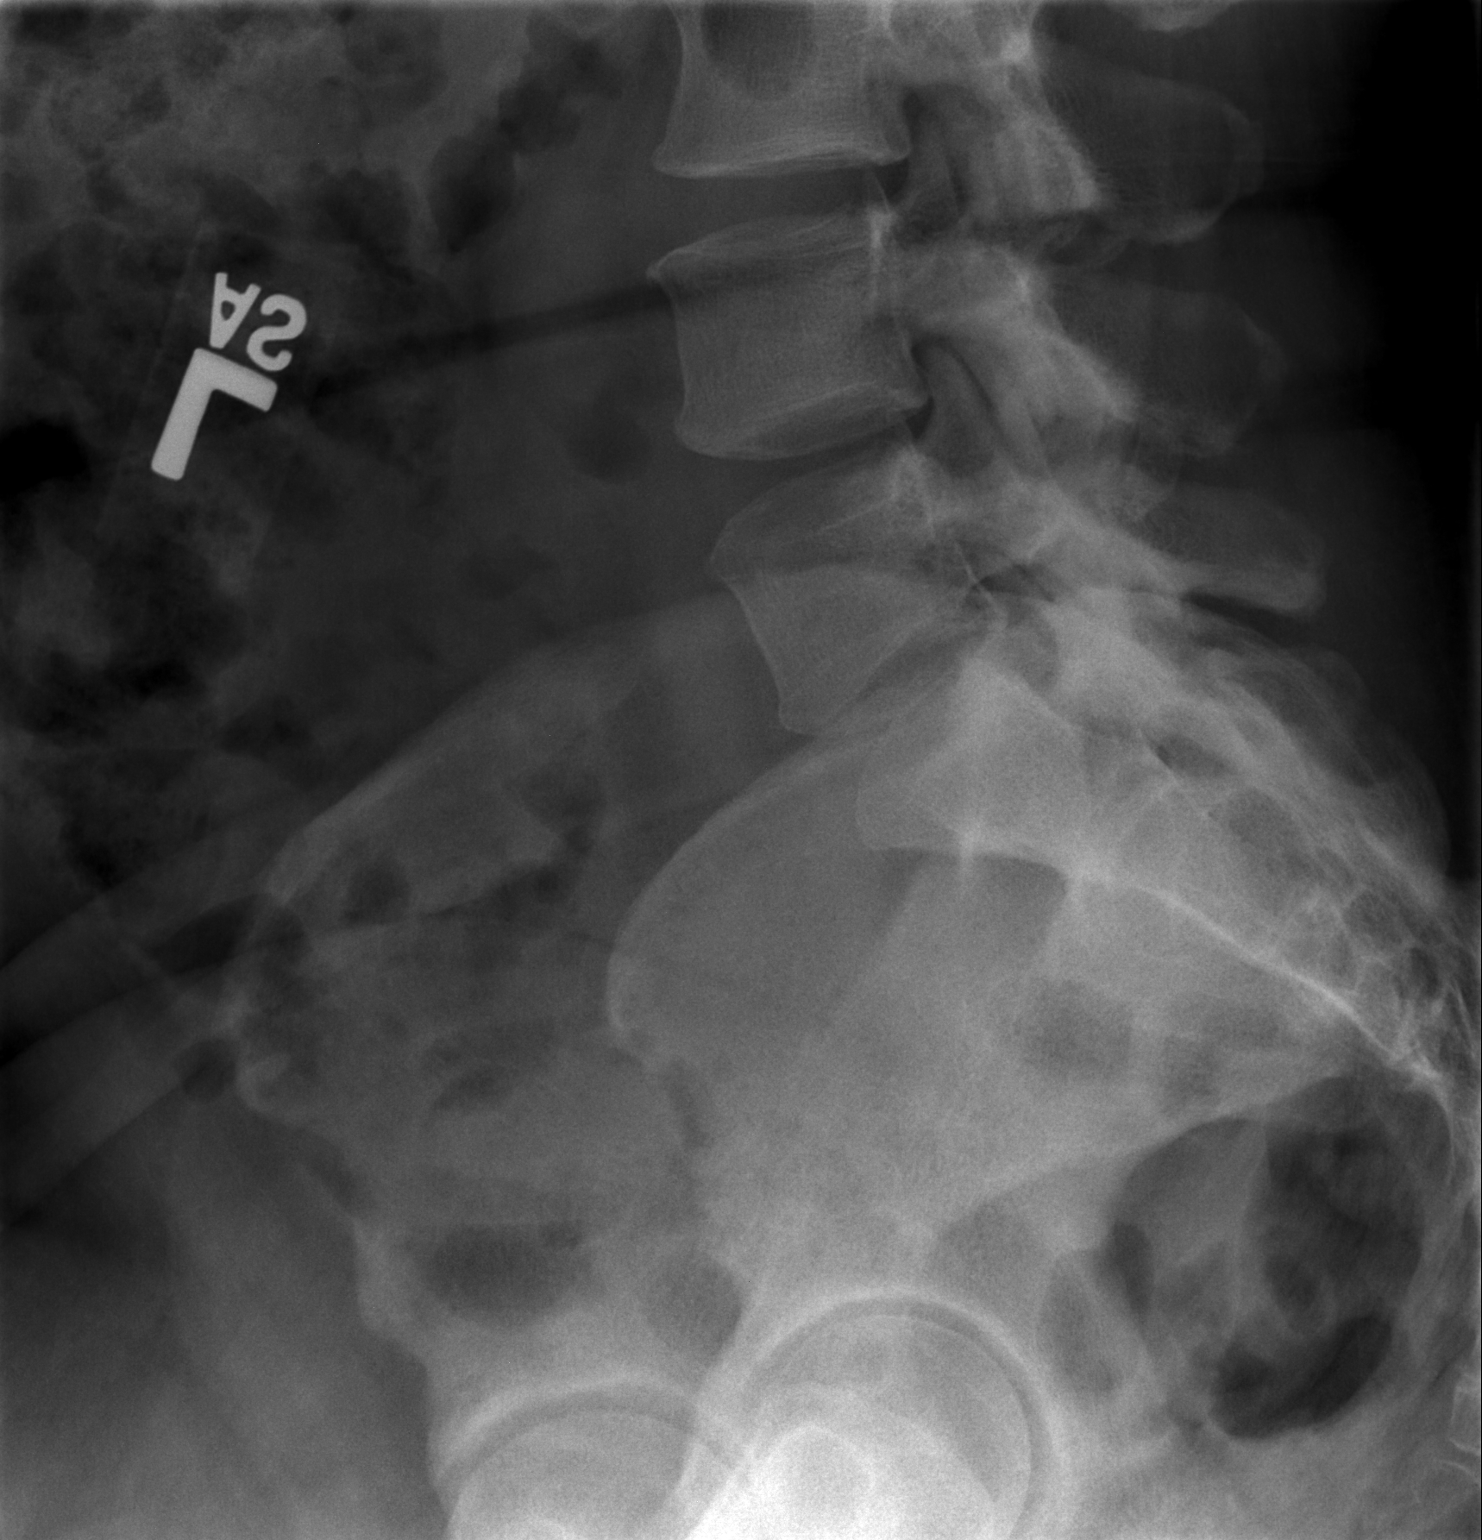

[3 of 3 positions shown; findings below may reference images not displayed]

FINDINGS: The lumbar vertebral bodies are preserved in height. The disc space
heights are well maintained. The pedicles and transverse processes
are intact. The L1 transverse processes are transitional. There is
mild facet joint hypertrophy at L4-5 and L5-S1.
IMPRESSION: There is no acute or significant chronic bony abnormality of the
lumbar spine. Given the patient's persistent symptoms, lumbar spine
MRI would be useful if the patient can undergo the procedure.

## 2017-08-03 DIAGNOSIS — K080 Exfoliation of teeth due to systemic causes: Secondary | ICD-10-CM | POA: Diagnosis not present

## 2017-08-19 DIAGNOSIS — E0500 Thyrotoxicosis with diffuse goiter without thyrotoxic crisis or storm: Secondary | ICD-10-CM | POA: Diagnosis not present

## 2017-09-29 DIAGNOSIS — E0500 Thyrotoxicosis with diffuse goiter without thyrotoxic crisis or storm: Secondary | ICD-10-CM | POA: Diagnosis not present

## 2017-11-04 DIAGNOSIS — Z1231 Encounter for screening mammogram for malignant neoplasm of breast: Secondary | ICD-10-CM | POA: Diagnosis not present

## 2017-11-24 DIAGNOSIS — E0500 Thyrotoxicosis with diffuse goiter without thyrotoxic crisis or storm: Secondary | ICD-10-CM | POA: Diagnosis not present

## 2018-01-10 DIAGNOSIS — E663 Overweight: Secondary | ICD-10-CM | POA: Diagnosis not present

## 2018-01-10 DIAGNOSIS — Z23 Encounter for immunization: Secondary | ICD-10-CM | POA: Diagnosis not present

## 2018-01-10 DIAGNOSIS — E890 Postprocedural hypothyroidism: Secondary | ICD-10-CM | POA: Diagnosis not present

## 2018-01-10 DIAGNOSIS — Z0000 Encounter for general adult medical examination without abnormal findings: Secondary | ICD-10-CM | POA: Diagnosis not present

## 2018-01-10 DIAGNOSIS — Z13220 Encounter for screening for lipoid disorders: Secondary | ICD-10-CM | POA: Diagnosis not present

## 2018-02-21 DIAGNOSIS — E890 Postprocedural hypothyroidism: Secondary | ICD-10-CM | POA: Diagnosis not present

## 2018-03-20 DIAGNOSIS — Z1211 Encounter for screening for malignant neoplasm of colon: Secondary | ICD-10-CM | POA: Diagnosis not present

## 2018-03-20 DIAGNOSIS — Z01419 Encounter for gynecological examination (general) (routine) without abnormal findings: Secondary | ICD-10-CM | POA: Diagnosis not present

## 2018-03-20 DIAGNOSIS — Z1239 Encounter for other screening for malignant neoplasm of breast: Secondary | ICD-10-CM | POA: Diagnosis not present

## 2018-03-20 DIAGNOSIS — Z6832 Body mass index (BMI) 32.0-32.9, adult: Secondary | ICD-10-CM | POA: Diagnosis not present

## 2018-04-09 DIAGNOSIS — B349 Viral infection, unspecified: Secondary | ICD-10-CM | POA: Diagnosis not present

## 2018-04-12 DIAGNOSIS — J40 Bronchitis, not specified as acute or chronic: Secondary | ICD-10-CM | POA: Diagnosis not present

## 2018-04-12 DIAGNOSIS — R509 Fever, unspecified: Secondary | ICD-10-CM | POA: Diagnosis not present

## 2018-04-21 DIAGNOSIS — J069 Acute upper respiratory infection, unspecified: Secondary | ICD-10-CM | POA: Diagnosis not present

## 2018-04-21 DIAGNOSIS — B9789 Other viral agents as the cause of diseases classified elsewhere: Secondary | ICD-10-CM | POA: Diagnosis not present

## 2018-05-04 DIAGNOSIS — K080 Exfoliation of teeth due to systemic causes: Secondary | ICD-10-CM | POA: Diagnosis not present

## 2018-05-25 DIAGNOSIS — R05 Cough: Secondary | ICD-10-CM | POA: Diagnosis not present

## 2018-05-25 DIAGNOSIS — M545 Low back pain: Secondary | ICD-10-CM | POA: Diagnosis not present

## 2018-06-05 ENCOUNTER — Ambulatory Visit
Admission: RE | Admit: 2018-06-05 | Discharge: 2018-06-05 | Disposition: A | Discharge status: Home / Self Care | Payer: Federal, State, Local not specified - PPO | Source: Ambulatory Visit | Attending: Internal Medicine | Admitting: Internal Medicine

## 2018-06-05 ENCOUNTER — Other Ambulatory Visit: Payer: Self-pay | Admitting: Internal Medicine

## 2018-06-05 DIAGNOSIS — S3992XA Unspecified injury of lower back, initial encounter: Secondary | ICD-10-CM | POA: Diagnosis not present

## 2018-06-05 DIAGNOSIS — M545 Low back pain, unspecified: Secondary | ICD-10-CM

## 2018-09-25 DIAGNOSIS — M5442 Lumbago with sciatica, left side: Secondary | ICD-10-CM | POA: Diagnosis not present

## 2018-09-25 DIAGNOSIS — M545 Low back pain: Secondary | ICD-10-CM | POA: Diagnosis not present

## 2018-09-25 DIAGNOSIS — M5441 Lumbago with sciatica, right side: Secondary | ICD-10-CM | POA: Diagnosis not present

## 2018-09-26 DIAGNOSIS — M5416 Radiculopathy, lumbar region: Secondary | ICD-10-CM | POA: Diagnosis not present

## 2018-10-21 DIAGNOSIS — G4733 Obstructive sleep apnea (adult) (pediatric): Secondary | ICD-10-CM | POA: Diagnosis not present

## 2018-10-26 DIAGNOSIS — M545 Low back pain: Secondary | ICD-10-CM | POA: Diagnosis not present

## 2018-10-31 DIAGNOSIS — M5416 Radiculopathy, lumbar region: Secondary | ICD-10-CM | POA: Diagnosis not present

## 2018-11-07 DIAGNOSIS — M5416 Radiculopathy, lumbar region: Secondary | ICD-10-CM | POA: Diagnosis not present

## 2018-11-09 DIAGNOSIS — M5416 Radiculopathy, lumbar region: Secondary | ICD-10-CM | POA: Diagnosis not present

## 2018-11-14 DIAGNOSIS — M5416 Radiculopathy, lumbar region: Secondary | ICD-10-CM | POA: Diagnosis not present

## 2018-11-16 DIAGNOSIS — M5416 Radiculopathy, lumbar region: Secondary | ICD-10-CM | POA: Diagnosis not present

## 2018-11-21 DIAGNOSIS — G4733 Obstructive sleep apnea (adult) (pediatric): Secondary | ICD-10-CM | POA: Diagnosis not present

## 2018-11-21 DIAGNOSIS — M5416 Radiculopathy, lumbar region: Secondary | ICD-10-CM | POA: Diagnosis not present

## 2018-11-23 DIAGNOSIS — M545 Low back pain: Secondary | ICD-10-CM | POA: Diagnosis not present

## 2018-11-23 DIAGNOSIS — Z1231 Encounter for screening mammogram for malignant neoplasm of breast: Secondary | ICD-10-CM | POA: Diagnosis not present

## 2018-11-29 DIAGNOSIS — E890 Postprocedural hypothyroidism: Secondary | ICD-10-CM | POA: Diagnosis not present

## 2018-12-20 DIAGNOSIS — M545 Low back pain: Secondary | ICD-10-CM | POA: Diagnosis not present

## 2018-12-20 DIAGNOSIS — M5442 Lumbago with sciatica, left side: Secondary | ICD-10-CM | POA: Diagnosis not present

## 2018-12-29 ENCOUNTER — Other Ambulatory Visit: Payer: Self-pay | Admitting: Orthopedic Surgery

## 2018-12-29 DIAGNOSIS — M545 Low back pain, unspecified: Secondary | ICD-10-CM

## 2019-01-15 ENCOUNTER — Other Ambulatory Visit: Payer: Self-pay

## 2019-01-15 ENCOUNTER — Ambulatory Visit
Admission: RE | Admit: 2019-01-15 | Discharge: 2019-01-15 | Disposition: A | Discharge status: Home / Self Care | Payer: Federal, State, Local not specified - PPO | Source: Ambulatory Visit | Attending: Orthopedic Surgery | Admitting: Orthopedic Surgery

## 2019-01-15 DIAGNOSIS — M545 Low back pain, unspecified: Secondary | ICD-10-CM

## 2019-01-15 DIAGNOSIS — M48061 Spinal stenosis, lumbar region without neurogenic claudication: Secondary | ICD-10-CM | POA: Diagnosis not present

## 2019-02-05 DIAGNOSIS — M545 Low back pain: Secondary | ICD-10-CM | POA: Diagnosis not present

## 2019-02-05 DIAGNOSIS — M7062 Trochanteric bursitis, left hip: Secondary | ICD-10-CM | POA: Diagnosis not present

## 2019-02-08 DIAGNOSIS — E663 Overweight: Secondary | ICD-10-CM | POA: Diagnosis not present

## 2019-02-08 DIAGNOSIS — Z13220 Encounter for screening for lipoid disorders: Secondary | ICD-10-CM | POA: Diagnosis not present

## 2019-02-08 DIAGNOSIS — Z0000 Encounter for general adult medical examination without abnormal findings: Secondary | ICD-10-CM | POA: Diagnosis not present

## 2019-02-08 DIAGNOSIS — E890 Postprocedural hypothyroidism: Secondary | ICD-10-CM | POA: Diagnosis not present

## 2019-03-12 DIAGNOSIS — M25552 Pain in left hip: Secondary | ICD-10-CM | POA: Diagnosis not present

## 2019-03-12 DIAGNOSIS — M545 Low back pain: Secondary | ICD-10-CM | POA: Diagnosis not present

## 2019-04-16 DIAGNOSIS — Z01419 Encounter for gynecological examination (general) (routine) without abnormal findings: Secondary | ICD-10-CM | POA: Diagnosis not present

## 2019-04-16 DIAGNOSIS — Z1239 Encounter for other screening for malignant neoplasm of breast: Secondary | ICD-10-CM | POA: Diagnosis not present

## 2019-04-16 DIAGNOSIS — Z6832 Body mass index (BMI) 32.0-32.9, adult: Secondary | ICD-10-CM | POA: Diagnosis not present

## 2019-04-16 DIAGNOSIS — Z1211 Encounter for screening for malignant neoplasm of colon: Secondary | ICD-10-CM | POA: Diagnosis not present

## 2019-07-06 DIAGNOSIS — M25511 Pain in right shoulder: Secondary | ICD-10-CM | POA: Diagnosis not present

## 2019-07-06 DIAGNOSIS — S46911A Strain of unspecified muscle, fascia and tendon at shoulder and upper arm level, right arm, initial encounter: Secondary | ICD-10-CM | POA: Diagnosis not present

## 2019-07-09 DIAGNOSIS — M542 Cervicalgia: Secondary | ICD-10-CM | POA: Diagnosis not present

## 2019-07-09 DIAGNOSIS — M67911 Unspecified disorder of synovium and tendon, right shoulder: Secondary | ICD-10-CM | POA: Diagnosis not present

## 2019-08-13 DIAGNOSIS — L299 Pruritus, unspecified: Secondary | ICD-10-CM | POA: Diagnosis not present

## 2019-08-13 DIAGNOSIS — L853 Xerosis cutis: Secondary | ICD-10-CM | POA: Diagnosis not present

## 2019-08-13 DIAGNOSIS — L819 Disorder of pigmentation, unspecified: Secondary | ICD-10-CM | POA: Diagnosis not present

## 2019-10-24 DIAGNOSIS — R5383 Other fatigue: Secondary | ICD-10-CM | POA: Diagnosis not present

## 2019-10-24 DIAGNOSIS — Z79899 Other long term (current) drug therapy: Secondary | ICD-10-CM | POA: Diagnosis not present

## 2019-10-24 DIAGNOSIS — R600 Localized edema: Secondary | ICD-10-CM | POA: Diagnosis not present

## 2019-11-08 ENCOUNTER — Ambulatory Visit: Payer: Federal, State, Local not specified - PPO | Admitting: Allergy

## 2019-11-08 ENCOUNTER — Other Ambulatory Visit: Payer: Self-pay

## 2019-11-08 ENCOUNTER — Encounter: Payer: Self-pay | Admitting: Allergy

## 2019-11-08 VITALS — BP 140/80 | HR 62 | Temp 97.9°F | Resp 16 | Ht 64.0 in | Wt 200.8 lb

## 2019-11-08 DIAGNOSIS — T781XXD Other adverse food reactions, not elsewhere classified, subsequent encounter: Secondary | ICD-10-CM

## 2019-11-08 DIAGNOSIS — H1013 Acute atopic conjunctivitis, bilateral: Secondary | ICD-10-CM

## 2019-11-08 DIAGNOSIS — L2481 Irritant contact dermatitis due to metals: Secondary | ICD-10-CM | POA: Diagnosis not present

## 2019-11-08 DIAGNOSIS — L308 Other specified dermatitis: Secondary | ICD-10-CM

## 2019-11-08 DIAGNOSIS — J3089 Other allergic rhinitis: Secondary | ICD-10-CM

## 2019-11-08 NOTE — Progress Notes (Signed)
New Patient Note  RE: Margaret Hughes MRN: [id number] DOB: Jun 19, 1960 Date of Office Visit: 11/08/2019  Referring provider: Seward Carol, MD Primary care provider: Seward Carol, MD  Chief Complaint: Itching, reaction to strawberries  History of present illness: Margaret Hughes is a 59 y.o. female presenting today for consultation for pruritus and food reaction.  She states she has been itching.  She does not notice a visible or palpable rash mostly on her back and arms.  Has had itching since 2020.  She will take benadryl when the itch is really bad.  She states she has taken another medication for itching that she believes starts with On- but doesn't remember the rest.  She has triamcinolone and applies at night and does help the itch.  She will also use benadryl cream.  She is seeing a dermatologist as well.  She has follow-up next month.  She states a biopsy will performed.   She would also like to see if the things she has been allergic to in the past has changed.   She recalls grass, ragweed, mold being positive on previous testing. She notes nasal congestion, headache, occascional itchy eyes, nasal draingae with throat clearing, sneezing. Symptoms are worse in spring and less in the summer.  She used to take allegra.  She also has take aleve cold and sinus.  She uses flonase periodically which he states does help.  She has used nasal sinus rinse but doesn't like the process.  She was using an eyedrop for dry eye.    Over the past 2 years with strawberry ingestion the roof of mouth, throat feel itchy and she does note nasal congestion.  She states she can have the symptoms just with washing of the fruit.    She is wondering if we test for metals.  She states she has a pair of jeans with a button that was bronze color and she develops hives in the area of skin contact.  It doesn't happen with all metals.   With elastic contact she states also causes her to itch.  She has a sports bra  with elastic material that causes her to have itching in that area of the band.    No history of asthma.    Review of systems: Review of Systems  Constitutional: Negative.   HENT: Negative.   Eyes: Negative.   Respiratory: Negative.   Cardiovascular: Negative.   Gastrointestinal: Negative.   Musculoskeletal: Negative.   Skin: Positive for itching and rash.  Neurological: Negative.     All other systems negative unless noted above in HPI  Past medical history: Past Medical History:  Diagnosis Date  . Carpal tunnel syndrome of left wrist 01/2014  . De Quervain's tenosynovitis, left 01/2014  . Dental bridge present    upper  . Dental crowns present   . Hypothyroidism   . 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 CARPAL TUNNEL RELEASE Left 01/30/2014   Procedure: CARPAL TUNNEL RELEASE;  Surgeon: Alta Corning, MD;  Location: [facility];  Service: Orthopedics;  Laterality: Left;  . CYSTOSCOPY WITH URETEROSCOPY AND STENT PLACEMENT Bilateral 10/19/2011  . DORSAL COMPARTMENT RELEASE Left 01/30/2014   Procedure: RELEASE DORSAL COMPARTMENT (DEQUERVAIN);  Surgeon: Alta Corning, MD;  Location: [facility];  Service: Orthopedics;  Laterality: Left;  . KNEE ARTHROSCOPY Right   .  PILONIDAL CYST / SINUS EXCISION  1998  . ROBOTIC ASSISTED TOTAL HYSTERECTOMY  10/19/2011  . SHOULDER ARTHROSCOPY Right 2014  . WISDOM TOOTH EXTRACTION  2008    Family history:  Family History  Problem Relation Age of Onset  . Asthma Father   . Emphysema Father   . Cancer Father   . Sickle cell trait Mother   . Thyroid disease Mother   . Diabetes Mother   . Hypertension Mother   . Kidney failure Sister   . Migraines Sister   . Kidney disease Sister   . Thyroid disease Sister     Social history: She lives in a home with carpeting with gas heating and  central cooling.  No pets in the home.  No concern for water damage, mildew or roaches in the home.  She is an Producer, television/film/video.  She denies a smoking history.  Medication List: Current Outpatient Medications  Medication Sig Dispense Refill  . estradiol (ESTRACE) 1 MG tablet Take 1 mg by mouth daily.    Marland Kitchen levothyroxine (SYNTHROID) 112 MCG tablet Take 112 mcg by mouth every morning.    . Multiple Vitamin (MULTIVITAMIN) tablet Take 1 tablet by mouth daily.    Marland Kitchen triamcinolone cream (KENALOG) 0.1 % Apply topically.    . Vitamin D, Ergocalciferol, (DRISDOL) 1.25 MG (50000 UNIT) CAPS capsule Take 50,000 Units by mouth once a week.    . estradiol (VIVELLE-DOT) 0.1 MG/24HR patch Place 1 patch onto the skin 2 (two) times a week. (Patient not taking: Reported on 11/08/2019)    . HYDROcodone-acetaminophen (NORCO/VICODIN) 5-325 MG per tablet Take 1-2 tablets by mouth every 6 (six) hours as needed for severe pain. (Patient not taking: Reported on 11/08/2019) 40 tablet 0  . levothyroxine (SYNTHROID, LEVOTHROID) 125 MCG tablet Take 110 mcg by mouth daily.  (Patient not taking: Reported on 11/08/2019)     No current facility-administered medications for this visit.    Known medication allergies: Allergies  Allergen Reactions  . Contrast Media [Iodinated Diagnostic Agents] Swelling    FACIAL SWELLING  . Ibuprofen Other (See Comments)    STOMACH IRRITATION  . Adhesive [Tape] Other (See Comments)    SKIN IRRITATION  . Tramadol Itching     Physical examination: Blood pressure 140/80, pulse 62, temperature 97.9 F (36.6 C), temperature source Temporal, resp. rate 16, height 5\' 4"  (1.626 m), weight 200 lb 12.8 oz (91.1 kg), last menstrual period 09/15/2011, SpO2 96 %.  General: Alert, interactive, in no acute distress. HEENT: PERRLA, TMs pearly gray, turbinates moderately edematous without discharge, post-pharynx non erythematous. Neck: Supple without lymphadenopathy. Lungs: Clear to auscultation without  wheezing, rhonchi or rales. {no increased work of breathing. CV: Normal S1, S2 without murmurs. Abdomen: Nondistended, nontender. Skin: Back with many hypopigmented round patches about the size of quarters. Extremities:  No clubbing, cyanosis or edema. Neuro:   Grossly intact.  Diagnositics/Labs: Allergy testing: Environmental allergy skin prick testing is positive to grass pollens, weed pollens, phoma betae. Intradermal testing is positive to mold mix 2, mold mix 4, cat and cockroach. 10 common food allergy skin prick testing as well as strawberry are negative Allergy testing results were read and interpreted by provider, documented by clinical staff.   Assessment and plan:   Allergic rhinitis with conjunctivitis  - environmental allergy testing is positive to grass pollens, weed pollens, molds, cat, cockroach  - allergen avoidance measures discussed/handouts provided  - recommend a long-acting antihistamine like Xyzal 5mg , Allegra 180mg  or Zyrtec 10mg  daily  as needed  - for nasal congestion recommend use of nasal steroid spray like Flonase, Rhinocort or Nasacort  - for itchy, watery, red eyes use over-the-counter Pataday 1 drop each eye daily as needed.   - can also use rewetting drops to keep eye from getting dry with use of antihistamine eye drop  - if medication management is not effective enough then consider course of allergen immunotherapy.    Dermatitis with pruritus  -Possibly eczematous with postinflammatory hypopigmentation versus other dermatitis.  Following with dermatology with upcoming appointment.  - continue use of triamcinolone as per dermatology  - daily moisturization with a thick emollient.   Provided with samples of Epiceram for you to try.  This is a prescription based moisturizer   Oral allergy syndrome  - skin testing to strawberry and common food allergens are negative  - with strawberry most likely symptoms due to oral allergy syndrome.  The oral allergy  syndrome (OAS) or pollen-food allergy syndrome (PFAS) is a relatively common form of food allergy, particularly in adults. It typically occurs in people who have pollen allergies when the immune system "sees" proteins on the food that look like proteins on the pollen. This results in the allergy antibody (IgE) binding to the food instead of the pollen. Patients typically report itching and/or mild swelling of the mouth and throat immediately following ingestion of certain uncooked fruits (including nuts) or raw vegetables. Only a very small number of affected individuals experience systemic allergic reactions, such as anaphylaxis which occurs with true food allergies.   See chart below.  Contact dermatitis  - patch testing discussed today for likely contact dermatitis.  We have patching for the TRUE test series (see below) as well as metal series.  Patch testing is best placed on a Monday with return to office on Wednesday and Friday of same week for readings.  Once patches are in place do not get them wet.  You do not need to stop any medications for patch testing however prednisone can interfere with test results.  You can also check with your dermatologist as they sometimes offer patch testing that is more comprehensive.    Follow-up in 3 months or sooner if needed   I appreciate the opportunity to take part in [facility] care. Please do not hesitate to contact me with questions.  Sincerely,   Prudy Feeler, MD Allergy/Immunology [facility]

## 2019-11-08 NOTE — Patient Instructions (Addendum)
 -   environmental allergy testing is positive to grass pollens, weed pollens, molds, cat, cockroach  - allergen avoidance measures discussed/handouts provided  - recommend a long-acting antihistamine like Xyzal 5mg , Allegra 180mg  or Zyrtec 10mg  daily as needed  - for nasal congestion recommend use of nasal steroid spray like Flonase, Rhinocort or Nasacort  - for itchy, watery, red eyes use over-the-counter Pataday 1 drop each eye daily as needed.   - can also use rewetting drops to keep eye from getting dry with use of antihistamine eye drop  - if medication management is not effective enough then consider course of allergen immunotherapy.     - continue use of triamcinolone as per dermatology  - daily moisturization with a thick emollient.   Provided with samples of Epiceram for you to try.  This is a prescription based moisturizer    - skin testing to strawberry and common food allergens are negative  - with strawberry most likely symptoms due to oral allergy syndrome.  The oral allergy syndrome (OAS) or pollen-food allergy syndrome (PFAS) is a relatively common form of food allergy, particularly in adults. It typically occurs in people who have pollen allergies when the immune system "sees" proteins on the food that look like proteins on the pollen. This results in the allergy antibody (IgE) binding to the food instead of the pollen. Patients typically report itching and/or mild swelling of the mouth and throat immediately following ingestion of certain uncooked fruits (including nuts) or raw vegetables. Only a very small number of affected individuals experience systemic allergic reactions, such as anaphylaxis which occurs with true food allergies.   See chart below.   - patch testing discussed today for likely contact dermatitis.  We have patching for the TRUE test series (see below) as well as metal series.  Patch testing is best placed on a Monday with return to office on Wednesday and Friday  of same week for readings.  Once patches are in place do not get them wet.  You do not need to stop any medications for patch testing however prednisone can interfere with test results.  You can also check with your dermatologist as they sometimes offer patch testing that is more comprehensive.    Follow-up in 3 months or sooner if needed     True Test looks for the following sensitivities:

## 2019-11-20 DIAGNOSIS — J029 Acute pharyngitis, unspecified: Secondary | ICD-10-CM | POA: Diagnosis not present

## 2019-12-13 DIAGNOSIS — Z1231 Encounter for screening mammogram for malignant neoplasm of breast: Secondary | ICD-10-CM | POA: Diagnosis not present

## 2019-12-20 DIAGNOSIS — L299 Pruritus, unspecified: Secondary | ICD-10-CM | POA: Diagnosis not present

## 2019-12-20 DIAGNOSIS — D485 Neoplasm of uncertain behavior of skin: Secondary | ICD-10-CM | POA: Diagnosis not present

## 2019-12-20 DIAGNOSIS — L739 Follicular disorder, unspecified: Secondary | ICD-10-CM | POA: Diagnosis not present

## 2019-12-20 NOTE — Progress Notes (Signed)
 Subjective: Margaret Hughes is a 59 y.o. female who comes in today as a return patient who has was seen in May 2021 for probable eczema and pruritus. She states that the topical TAC helped a bit to control the itching, but only for a short time. She is now starting to get new spots on the legs.  Still with severe pruritus.   Past medical history, family history, and social history has been reviewed and is documented on the nursing intake sheet.  Review of systems is negative for any other lumps, swelling, or dermatologic conditions. ROS is also negative for any fevers, or unintentional weight loss.  Objective:  BP 123/68   Pulse 61   Temp 96.8 F (36 C)   Ht 1.626 m (5' 4)   Wt 91 kg (200 lb 9.6 oz)   SpO2 100%   BMI 34.43 kg/m   Patient is well nourished, well hydrated 59 y.o. female in no acute distress with appropriate mood and affect. Alert and oriented X 3. Exam today focused on the skin and included the following areas:  Face (Ears, Nose, Mouth) Eyes (Eyelids and conjunctivae) Scalp Neck Chest Back Bilateral upper extremities  Pertinent Findings include:       -    There are hypopigmented circular patches on the upper back and shoulders. There are a few on the face with one on the right upper forehead above the eyebrow and one on the left temples. Chest is clear. On the back, there are well healed hyperpigmented linear macules.   Assessment:  Possible mild eczema vs CTCL with severe pruritus and linear scratching scars  Plan:  The above diagnosis and treatment options were reviewed with the patient. We educated the patient today about the benign nature of some of the lesions seen on today's exam Change to gentle cleansers and moisturizers After discussion of risk and benefit, informed consent was obtained and a time-out was performed.   The suspected eczema vs CTCL and the surrounding area were given a sterile prep using alcohol and draped in the usual sterile  fashion. Anesthesia was accomplished using 1% buffered lidocaine  and 4mm punch biopsy performed.  Hemostasis achieved. Sterile dressing applied.  The specimen was sent for pathologic examination. The patient tolerated the procedure well and post care instructions given. Patient will be called with biopsy results.  Begin clobetasol ointment to the affected areas in tapering fashion  Her husband will take the sutures out in 10 days and we will call her about the results and set up a follow based upon the response to treatment the biopsy results.  We can see the patient back in 3 months .   Electronically signed by: Greig JINNY Blower, MD 12/20/2019 9:37 AM     Electronically signed by: Greig JINNY Blower, MD 12/21/19 [id number]

## 2020-02-13 ENCOUNTER — Ambulatory Visit: Payer: Federal, State, Local not specified - PPO | Admitting: Allergy

## 2020-03-12 DIAGNOSIS — E0500 Thyrotoxicosis with diffuse goiter without thyrotoxic crisis or storm: Secondary | ICD-10-CM | POA: Diagnosis not present

## 2020-03-12 DIAGNOSIS — Z13220 Encounter for screening for lipoid disorders: Secondary | ICD-10-CM | POA: Diagnosis not present

## 2020-03-12 DIAGNOSIS — E890 Postprocedural hypothyroidism: Secondary | ICD-10-CM | POA: Diagnosis not present

## 2020-03-12 DIAGNOSIS — R030 Elevated blood-pressure reading, without diagnosis of hypertension: Secondary | ICD-10-CM | POA: Diagnosis not present

## 2020-03-12 DIAGNOSIS — Z0000 Encounter for general adult medical examination without abnormal findings: Secondary | ICD-10-CM | POA: Diagnosis not present

## 2020-03-19 DIAGNOSIS — R030 Elevated blood-pressure reading, without diagnosis of hypertension: Secondary | ICD-10-CM | POA: Diagnosis not present

## 2020-04-09 DIAGNOSIS — E7800 Pure hypercholesterolemia, unspecified: Secondary | ICD-10-CM | POA: Diagnosis not present

## 2020-04-09 DIAGNOSIS — M25511 Pain in right shoulder: Secondary | ICD-10-CM | POA: Diagnosis not present

## 2020-04-09 DIAGNOSIS — I10 Essential (primary) hypertension: Secondary | ICD-10-CM | POA: Diagnosis not present

## 2020-04-15 DIAGNOSIS — Z1211 Encounter for screening for malignant neoplasm of colon: Secondary | ICD-10-CM | POA: Diagnosis not present

## 2020-04-15 DIAGNOSIS — Z01419 Encounter for gynecological examination (general) (routine) without abnormal findings: Secondary | ICD-10-CM | POA: Diagnosis not present

## 2020-04-15 DIAGNOSIS — Z1239 Encounter for other screening for malignant neoplasm of breast: Secondary | ICD-10-CM | POA: Diagnosis not present

## 2020-04-15 DIAGNOSIS — Z79890 Hormone replacement therapy (postmenopausal): Secondary | ICD-10-CM | POA: Diagnosis not present

## 2020-05-06 DIAGNOSIS — M79644 Pain in right finger(s): Secondary | ICD-10-CM | POA: Diagnosis not present

## 2020-05-20 DIAGNOSIS — M79644 Pain in right finger(s): Secondary | ICD-10-CM | POA: Diagnosis not present

## 2020-06-14 DIAGNOSIS — M5442 Lumbago with sciatica, left side: Secondary | ICD-10-CM | POA: Diagnosis not present

## 2020-06-14 DIAGNOSIS — M545 Low back pain, unspecified: Secondary | ICD-10-CM | POA: Diagnosis not present

## 2020-06-14 DIAGNOSIS — M25552 Pain in left hip: Secondary | ICD-10-CM | POA: Diagnosis not present

## 2020-07-10 DIAGNOSIS — E7800 Pure hypercholesterolemia, unspecified: Secondary | ICD-10-CM | POA: Diagnosis not present

## 2020-07-10 DIAGNOSIS — I10 Essential (primary) hypertension: Secondary | ICD-10-CM | POA: Diagnosis not present

## 2020-07-10 DIAGNOSIS — R110 Nausea: Secondary | ICD-10-CM | POA: Diagnosis not present

## 2020-12-18 DIAGNOSIS — Z1231 Encounter for screening mammogram for malignant neoplasm of breast: Secondary | ICD-10-CM | POA: Diagnosis not present

## 2021-01-08 IMAGING — MR MR LUMBAR SPINE W/O CM
4 of 6 series · 20 of 48 positions shown · non-contrast
Comparison: Radiography 06/05/2018.  MRI 07/27/2015.

CLINICAL DATA: Low back pain radiating to the left hip beginning in
[REDACTED]

EXAM:
MRI LUMBAR SPINE WITHOUT CONTRAST
TECHNIQUE: Multiplanar, multisequence MR imaging of the lumbar spine was
performed. No intravenous contrast was administered.

[Series 6: T2 · sagittal · 4.0mm · 0.73mm/px · 5 of 15 slices shown (1 of 3)]
[im 1/15]
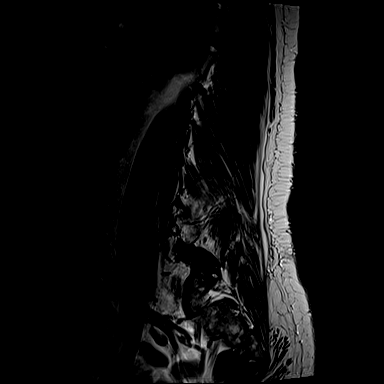
[im 4/15]
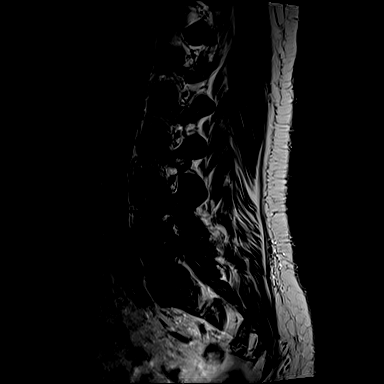
[im 8/15]
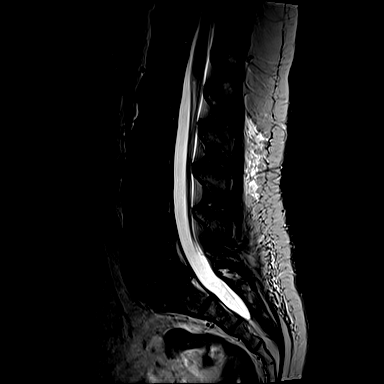
[im 11/15]
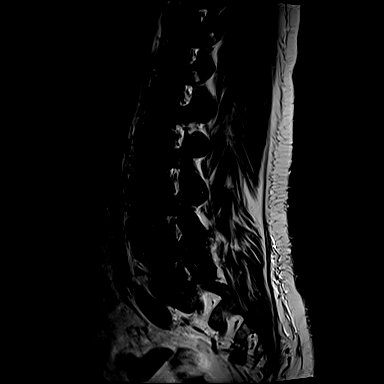
[im 15/15]
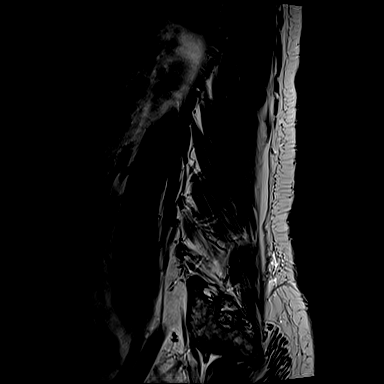

[Series 7: T1 · sagittal · 4.0mm · 0.73mm/px · 3 of 15 slices shown]
[im 1/15]
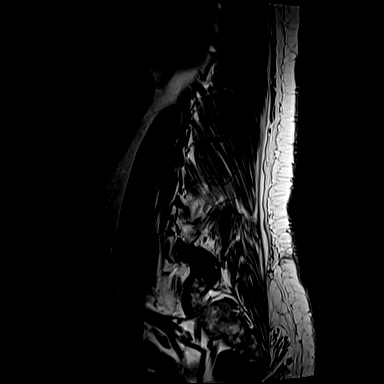
[im 8/15]
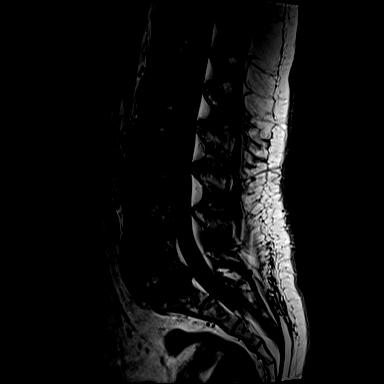
[im 15/15]
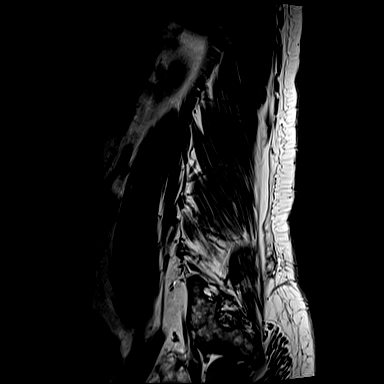

[Series 13: T2 · axial · 4.0mm · 0.28mm/px · z∈[-153,+86]mm · 8 of 43 slices shown (2 of 3)]
[im 1/43]
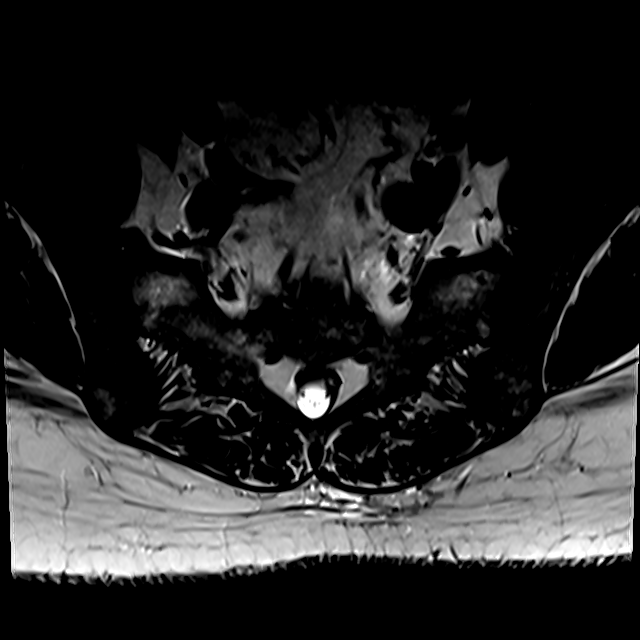
[im 7/43]
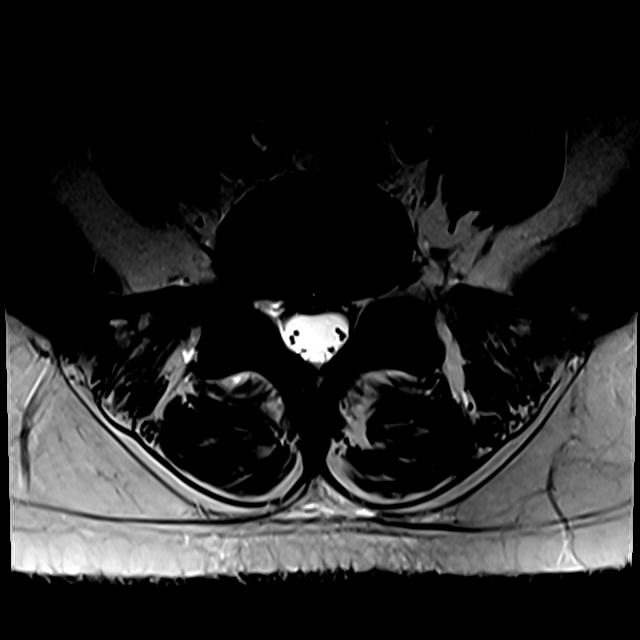
[im 13/43]
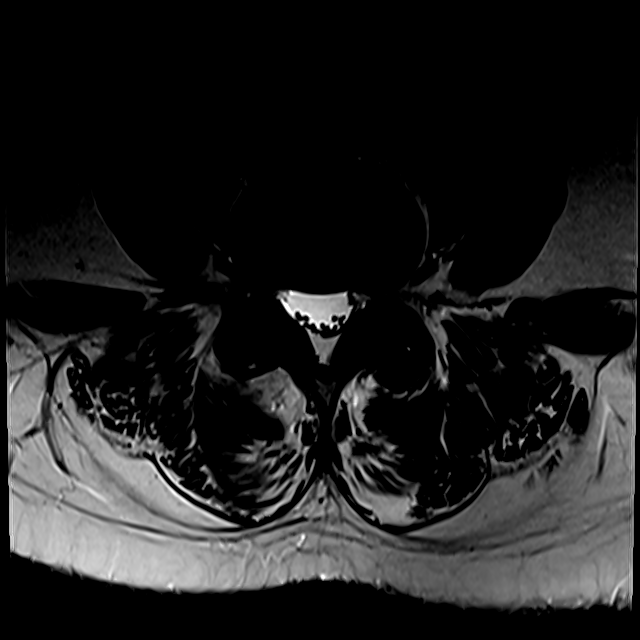
[im 20/43]
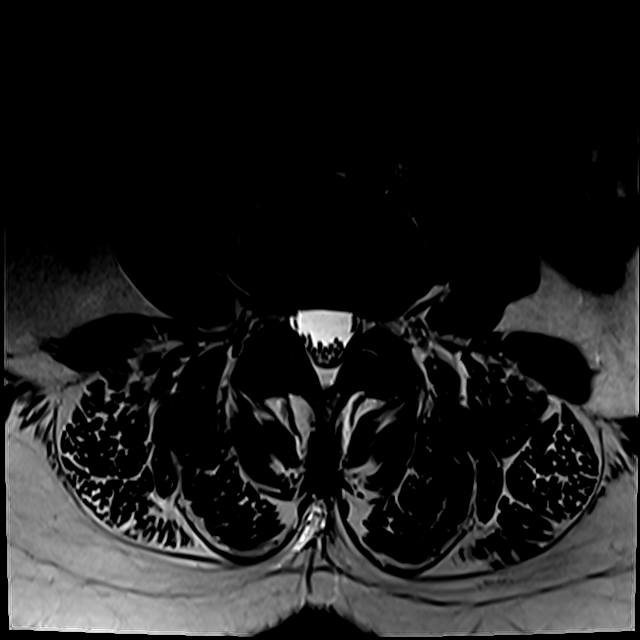
[im 23/43]
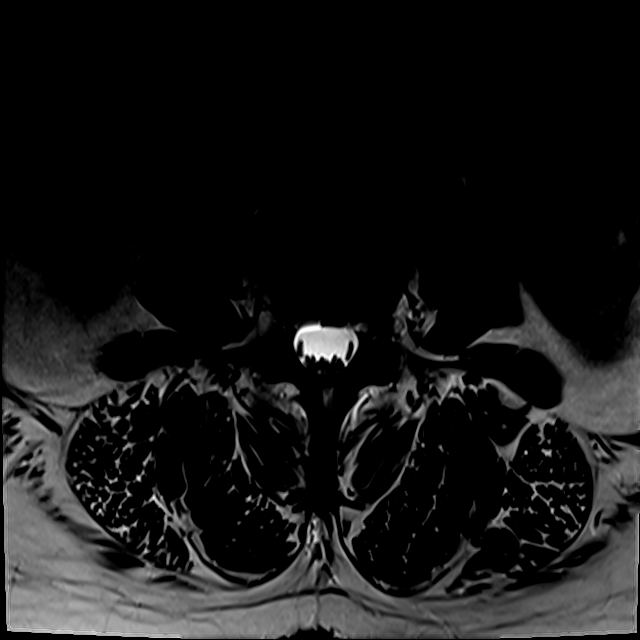
[im 30/43]
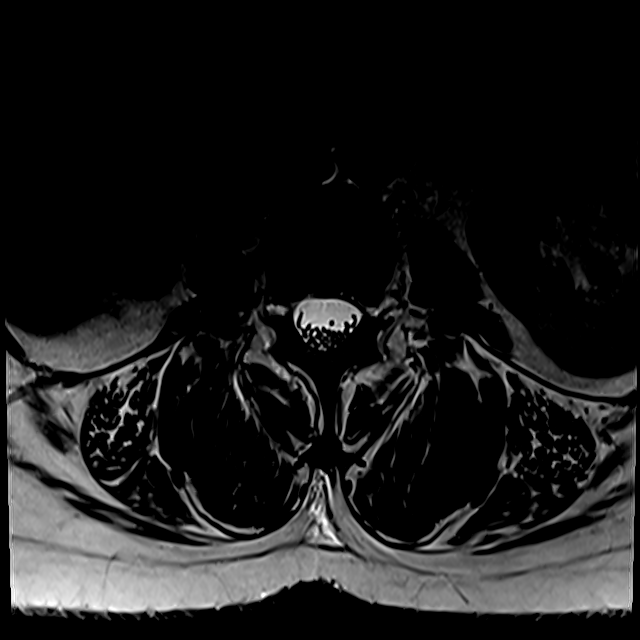
[im 36/43]
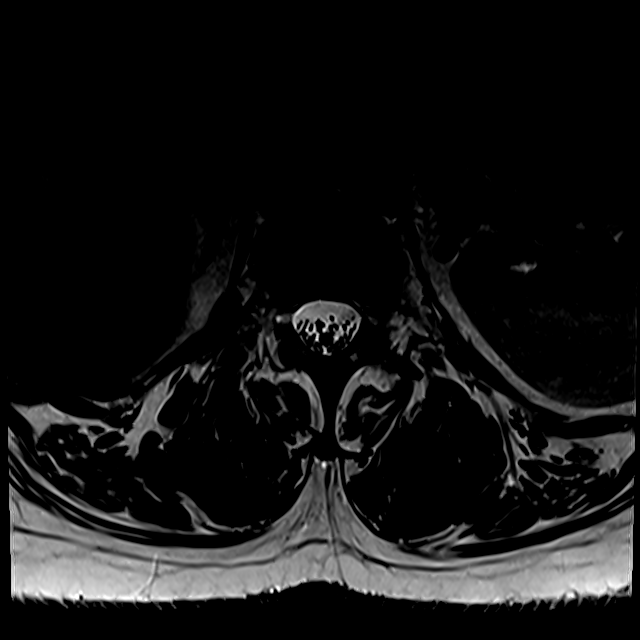
[im 43/43]
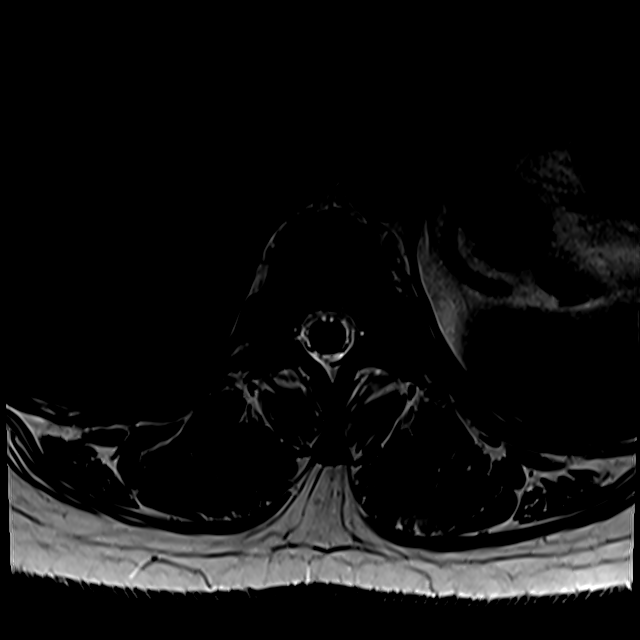

[Series 14: T2 · sagittal · 4.0mm · 0.73mm/px · 4 of 15 slices shown (3 of 3)]
[im 1/15]
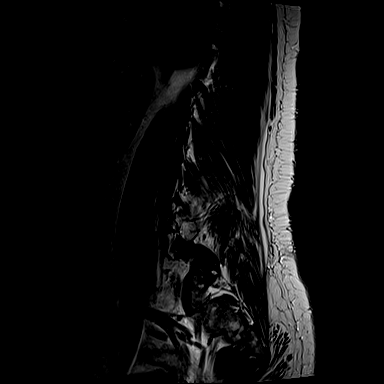
[im 4/15]
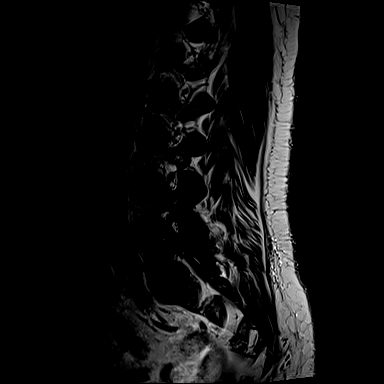
[im 8/15]
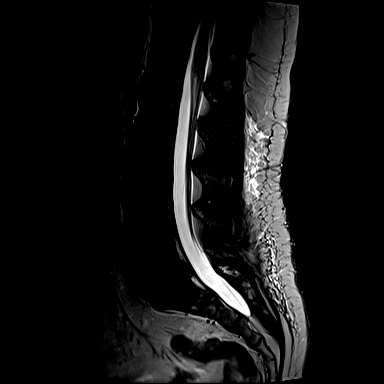
[im 15/15]
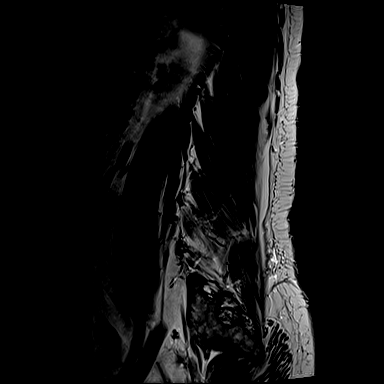

[20 of 48 positions shown; findings below may reference images not displayed]

FINDINGS: Segmentation:  5 lumbar type vertebral bodies.

Alignment:  Normal

Vertebrae:  No fracture or primary bone lesion.

Conus medullaris and cauda equina: Conus extends to the L1 level.
Conus and cauda equina appear normal.

Paraspinal and other soft tissues: Normal

Disc levels:

L1-2: Normal.

L2-3: No disc abnormality. Facet osteoarthritis on the right with a
small synovial cyst extending into the superior foramen. Some
potential this could affect the right L2 nerve, but the patient does
not described right-sided symptoms.

L3-4: No disc abnormality. Mild facet osteoarthritis. No canal or
foraminal stenosis.

L4-5: No disc abnormality. Bilateral facet osteoarthritis, including
AA 5 mm synovial cyst projecting inward from the facet on the left.
This causes some narrowing of the left lateral recess and proximal
foramen on the left that could possibly affect left L4 or L5 nerves.
Facet arthritis could also be a cause of back pain or referred facet
syndrome pain.

L5-S1: Mild disc desiccation and bulging. Mild facet osteoarthritis.
No compressive narrowing of the canal or foramina. Facet arthritis
could contribute to low back pain or referred facet syndrome pain.

Since the study of 7354, the facet arthritis has worsened,
particularly at the L4-5 level with the development of the
left-sided lateral recess and foraminal stenosis.
IMPRESSION: Worsening of facet osteoarthritis in the lower lumbar spine
particularly at the L4-5 and L5-S1 levels that could be a cause low
back pain or referred facet syndrome pain. At L4-5, there is a 5 mm
synovial cyst projecting inward on the left which encroaches upon
the left lateral recess and proximal foramen on the left that would
have potential to affect the left L4 or L5 nerve roots. These
findings have worsened since 7354.

Chronic disc degeneration at L5-S1 with a noncompressive disc bulge.

## 2021-03-19 DIAGNOSIS — I10 Essential (primary) hypertension: Secondary | ICD-10-CM | POA: Diagnosis not present

## 2021-03-19 DIAGNOSIS — Z23 Encounter for immunization: Secondary | ICD-10-CM | POA: Diagnosis not present

## 2021-03-19 DIAGNOSIS — E7800 Pure hypercholesterolemia, unspecified: Secondary | ICD-10-CM | POA: Diagnosis not present

## 2021-03-19 DIAGNOSIS — Z0000 Encounter for general adult medical examination without abnormal findings: Secondary | ICD-10-CM | POA: Diagnosis not present

## 2021-04-17 DIAGNOSIS — R0781 Pleurodynia: Secondary | ICD-10-CM | POA: Diagnosis not present

## 2021-04-17 DIAGNOSIS — M62838 Other muscle spasm: Secondary | ICD-10-CM | POA: Diagnosis not present

## 2021-04-22 DIAGNOSIS — Z01419 Encounter for gynecological examination (general) (routine) without abnormal findings: Secondary | ICD-10-CM | POA: Diagnosis not present

## 2021-04-22 DIAGNOSIS — Z1231 Encounter for screening mammogram for malignant neoplasm of breast: Secondary | ICD-10-CM | POA: Diagnosis not present

## 2021-04-22 DIAGNOSIS — Z1211 Encounter for screening for malignant neoplasm of colon: Secondary | ICD-10-CM | POA: Diagnosis not present

## 2021-04-22 DIAGNOSIS — Z79890 Hormone replacement therapy (postmenopausal): Secondary | ICD-10-CM | POA: Diagnosis not present

## 2021-04-27 DIAGNOSIS — R0602 Shortness of breath: Secondary | ICD-10-CM | POA: Diagnosis not present

## 2021-04-27 DIAGNOSIS — R079 Chest pain, unspecified: Secondary | ICD-10-CM | POA: Diagnosis not present

## 2021-08-26 DIAGNOSIS — Z1211 Encounter for screening for malignant neoplasm of colon: Secondary | ICD-10-CM | POA: Diagnosis not present

## 2021-08-26 DIAGNOSIS — K648 Other hemorrhoids: Secondary | ICD-10-CM | POA: Diagnosis not present

## 2021-11-05 DIAGNOSIS — K080 Exfoliation of teeth due to systemic causes: Secondary | ICD-10-CM | POA: Diagnosis not present

## 2021-12-24 DIAGNOSIS — Z1231 Encounter for screening mammogram for malignant neoplasm of breast: Secondary | ICD-10-CM | POA: Diagnosis not present

## 2022-02-02 DIAGNOSIS — M79644 Pain in right finger(s): Secondary | ICD-10-CM | POA: Diagnosis not present

## 2022-04-06 DIAGNOSIS — M79644 Pain in right finger(s): Secondary | ICD-10-CM | POA: Diagnosis not present

## 2022-04-18 DIAGNOSIS — T7840XA Allergy, unspecified, initial encounter: Secondary | ICD-10-CM | POA: Diagnosis not present

## 2022-05-28 DIAGNOSIS — M1711 Unilateral primary osteoarthritis, right knee: Secondary | ICD-10-CM | POA: Diagnosis not present

## 2022-06-02 DIAGNOSIS — Z01419 Encounter for gynecological examination (general) (routine) without abnormal findings: Secondary | ICD-10-CM | POA: Diagnosis not present

## 2022-06-10 DIAGNOSIS — Z0000 Encounter for general adult medical examination without abnormal findings: Secondary | ICD-10-CM | POA: Diagnosis not present

## 2022-06-10 DIAGNOSIS — E663 Overweight: Secondary | ICD-10-CM | POA: Diagnosis not present

## 2022-06-10 DIAGNOSIS — E7800 Pure hypercholesterolemia, unspecified: Secondary | ICD-10-CM | POA: Diagnosis not present

## 2022-06-10 DIAGNOSIS — I10 Essential (primary) hypertension: Secondary | ICD-10-CM | POA: Diagnosis not present

## 2022-06-10 DIAGNOSIS — E890 Postprocedural hypothyroidism: Secondary | ICD-10-CM | POA: Diagnosis not present

## 2022-07-14 DIAGNOSIS — M25561 Pain in right knee: Secondary | ICD-10-CM | POA: Diagnosis not present

## 2022-07-14 DIAGNOSIS — M1711 Unilateral primary osteoarthritis, right knee: Secondary | ICD-10-CM | POA: Diagnosis not present

## 2022-07-22 DIAGNOSIS — M25561 Pain in right knee: Secondary | ICD-10-CM | POA: Diagnosis not present

## 2022-07-27 DIAGNOSIS — M1711 Unilateral primary osteoarthritis, right knee: Secondary | ICD-10-CM | POA: Diagnosis not present

## 2022-08-31 DIAGNOSIS — M1711 Unilateral primary osteoarthritis, right knee: Secondary | ICD-10-CM | POA: Diagnosis not present

## 2022-09-09 DIAGNOSIS — M1711 Unilateral primary osteoarthritis, right knee: Secondary | ICD-10-CM | POA: Diagnosis not present

## 2022-09-21 DIAGNOSIS — M1711 Unilateral primary osteoarthritis, right knee: Secondary | ICD-10-CM | POA: Diagnosis not present

## 2022-09-28 DIAGNOSIS — S50862A Insect bite (nonvenomous) of left forearm, initial encounter: Secondary | ICD-10-CM | POA: Diagnosis not present

## 2022-09-29 DIAGNOSIS — H35363 Drusen (degenerative) of macula, bilateral: Secondary | ICD-10-CM | POA: Diagnosis not present

## 2022-11-25 DIAGNOSIS — M79605 Pain in left leg: Secondary | ICD-10-CM | POA: Diagnosis not present

## 2022-12-27 DIAGNOSIS — Z1231 Encounter for screening mammogram for malignant neoplasm of breast: Secondary | ICD-10-CM | POA: Diagnosis not present

## 2022-12-30 DIAGNOSIS — M1811 Unilateral primary osteoarthritis of first carpometacarpal joint, right hand: Secondary | ICD-10-CM | POA: Diagnosis not present

## 2023-03-08 DIAGNOSIS — M79641 Pain in right hand: Secondary | ICD-10-CM | POA: Diagnosis not present

## 2023-03-08 DIAGNOSIS — M7989 Other specified soft tissue disorders: Secondary | ICD-10-CM | POA: Diagnosis not present

## 2023-04-07 DIAGNOSIS — R059 Cough, unspecified: Secondary | ICD-10-CM | POA: Diagnosis not present

## 2023-04-07 DIAGNOSIS — J069 Acute upper respiratory infection, unspecified: Secondary | ICD-10-CM | POA: Diagnosis not present

## 2023-04-07 DIAGNOSIS — Z03818 Encounter for observation for suspected exposure to other biological agents ruled out: Secondary | ICD-10-CM | POA: Diagnosis not present

## 2023-04-18 DIAGNOSIS — M2559 Pain in other specified joint: Secondary | ICD-10-CM | POA: Diagnosis not present

## 2023-04-18 DIAGNOSIS — M7989 Other specified soft tissue disorders: Secondary | ICD-10-CM | POA: Diagnosis not present

## 2023-04-20 DIAGNOSIS — M5441 Lumbago with sciatica, right side: Secondary | ICD-10-CM | POA: Diagnosis not present

## 2023-04-20 DIAGNOSIS — S39012A Strain of muscle, fascia and tendon of lower back, initial encounter: Secondary | ICD-10-CM | POA: Diagnosis not present

## 2023-04-26 DIAGNOSIS — M5441 Lumbago with sciatica, right side: Secondary | ICD-10-CM | POA: Diagnosis not present

## 2023-04-26 DIAGNOSIS — M545 Low back pain, unspecified: Secondary | ICD-10-CM | POA: Diagnosis not present

## 2023-05-02 DIAGNOSIS — M5441 Lumbago with sciatica, right side: Secondary | ICD-10-CM | POA: Diagnosis not present

## 2023-05-02 DIAGNOSIS — M545 Low back pain, unspecified: Secondary | ICD-10-CM | POA: Diagnosis not present

## 2023-05-04 DIAGNOSIS — M5441 Lumbago with sciatica, right side: Secondary | ICD-10-CM | POA: Diagnosis not present

## 2023-05-04 DIAGNOSIS — M545 Low back pain, unspecified: Secondary | ICD-10-CM | POA: Diagnosis not present

## 2023-05-05 ENCOUNTER — Other Ambulatory Visit: Payer: Self-pay | Admitting: Obstetrics and Gynecology

## 2023-05-05 DIAGNOSIS — Z1231 Encounter for screening mammogram for malignant neoplasm of breast: Secondary | ICD-10-CM

## 2023-05-09 DIAGNOSIS — M545 Low back pain, unspecified: Secondary | ICD-10-CM | POA: Diagnosis not present

## 2023-05-09 DIAGNOSIS — M5441 Lumbago with sciatica, right side: Secondary | ICD-10-CM | POA: Diagnosis not present

## 2023-05-16 DIAGNOSIS — M5441 Lumbago with sciatica, right side: Secondary | ICD-10-CM | POA: Diagnosis not present

## 2023-05-16 DIAGNOSIS — M545 Low back pain, unspecified: Secondary | ICD-10-CM | POA: Diagnosis not present

## 2023-06-07 DIAGNOSIS — Z79890 Hormone replacement therapy (postmenopausal): Secondary | ICD-10-CM | POA: Diagnosis not present

## 2023-06-07 DIAGNOSIS — Z01419 Encounter for gynecological examination (general) (routine) without abnormal findings: Secondary | ICD-10-CM | POA: Diagnosis not present

## 2023-06-23 DIAGNOSIS — E890 Postprocedural hypothyroidism: Secondary | ICD-10-CM | POA: Diagnosis not present

## 2023-06-23 DIAGNOSIS — E7800 Pure hypercholesterolemia, unspecified: Secondary | ICD-10-CM | POA: Diagnosis not present

## 2023-06-23 DIAGNOSIS — I10 Essential (primary) hypertension: Secondary | ICD-10-CM | POA: Diagnosis not present

## 2023-06-23 DIAGNOSIS — Z0000 Encounter for general adult medical examination without abnormal findings: Secondary | ICD-10-CM | POA: Diagnosis not present

## 2023-07-11 DIAGNOSIS — M1711 Unilateral primary osteoarthritis, right knee: Secondary | ICD-10-CM | POA: Diagnosis not present

## 2023-08-11 DIAGNOSIS — M1711 Unilateral primary osteoarthritis, right knee: Secondary | ICD-10-CM | POA: Diagnosis not present

## 2023-08-18 DIAGNOSIS — M1711 Unilateral primary osteoarthritis, right knee: Secondary | ICD-10-CM | POA: Diagnosis not present

## 2023-08-26 DIAGNOSIS — M1711 Unilateral primary osteoarthritis, right knee: Secondary | ICD-10-CM | POA: Diagnosis not present

## 2023-09-06 DIAGNOSIS — F458 Other somatoform disorders: Secondary | ICD-10-CM | POA: Diagnosis not present

## 2023-09-06 DIAGNOSIS — R490 Dysphonia: Secondary | ICD-10-CM | POA: Diagnosis not present

## 2023-09-06 DIAGNOSIS — R001 Bradycardia, unspecified: Secondary | ICD-10-CM | POA: Diagnosis not present

## 2023-11-10 ENCOUNTER — Ambulatory Visit (INDEPENDENT_AMBULATORY_CARE_PROVIDER_SITE_OTHER): Admitting: Otolaryngology

## 2023-11-10 ENCOUNTER — Encounter (INDEPENDENT_AMBULATORY_CARE_PROVIDER_SITE_OTHER): Payer: Self-pay | Admitting: Otolaryngology

## 2023-11-10 VITALS — BP 119/74 | HR 57

## 2023-11-10 DIAGNOSIS — R09A2 Foreign body sensation, throat: Secondary | ICD-10-CM | POA: Diagnosis not present

## 2023-11-10 DIAGNOSIS — K219 Gastro-esophageal reflux disease without esophagitis: Secondary | ICD-10-CM | POA: Diagnosis not present

## 2023-11-10 DIAGNOSIS — J342 Deviated nasal septum: Secondary | ICD-10-CM

## 2023-11-10 DIAGNOSIS — R0981 Nasal congestion: Secondary | ICD-10-CM

## 2023-11-10 DIAGNOSIS — M7989 Other specified soft tissue disorders: Secondary | ICD-10-CM | POA: Diagnosis not present

## 2023-11-10 DIAGNOSIS — R0982 Postnasal drip: Secondary | ICD-10-CM | POA: Diagnosis not present

## 2023-11-10 DIAGNOSIS — J343 Hypertrophy of nasal turbinates: Secondary | ICD-10-CM

## 2023-11-10 DIAGNOSIS — J3089 Other allergic rhinitis: Secondary | ICD-10-CM | POA: Diagnosis not present

## 2023-11-10 MED ORDER — FLUTICASONE PROPIONATE 50 MCG/ACT NA SUSP: 2.0000 | Freq: Two times a day (BID) | NASAL | 6 refills | Status: DC

## 2023-11-10 MED ORDER — LEVOCETIRIZINE DIHYDROCHLORIDE 5 MG PO TABS: 5.0000 mg | ORAL_TABLET | Freq: Every evening | ORAL | 3 refills | Status: DC

## 2023-11-10 NOTE — Patient Instructions (Addendum)

## 2023-11-10 NOTE — Progress Notes (Signed)
 ENT CONSULT:  Reason for Consult: globus sensation    HPI: Discussed the use of AI scribe software for clinical note transcription with the patient, who gave verbal consent to proceed.  History of Present Illness Margaret Hughes is a 63 year old female who presents with a sensation of mucus and a lump in her throat.  She experiences a persistent sensation of mucus in her throat, described as feeling like 'something' is there that does not move. This sensation has been present for an unspecified duration, with a noted flare-up before a recent trip out of town. She has been drinking hot lemon ginger tea in an attempt to alleviate the sensation.  In January, she was diagnosed with a cough and upper respiratory illness, although the throat sensation was present before this visit.  No difficulty swallowing and she is able to eat a regular diet. She has a history of heartburn and inflammatory bowel disease (IBD). She was advised to take Allegra for allergies diagnosed based on testing, but she is not currently on allergy  shots.  Records Reviewed:  Seen by PCP for cough and URI on 04/07/23 Dr Rexanne  Dermatology visit 06/05/22 Margaret Hughes is a 64 y.o. female who comes in today as a return patient who has was seen in May 2021 for probable eczema and pruritus. She states that the topical TAC helped a bit to control the itching, but only for a short time. She is now starting to get new spots on the legs.  Still with severe pruritus.   Past medical history, family history, and social history has been reviewed and is documented on the nursing intake sheet.  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    Past Surgical History:  Procedure  Laterality Date   CARPAL TUNNEL RELEASE Left 01/30/2014   Procedure: CARPAL TUNNEL RELEASE;  Surgeon: Norleen LITTIE Gavel, MD;  Location: [facility];  Service: Orthopedics;  Laterality: Left;   CYSTOSCOPY WITH URETEROSCOPY AND STENT PLACEMENT Bilateral 10/19/2011   DORSAL COMPARTMENT RELEASE Left 01/30/2014   Procedure: RELEASE DORSAL COMPARTMENT (DEQUERVAIN);  Surgeon: Norleen LITTIE Gavel, MD;  Location: [facility];  Service: Orthopedics;  Laterality: Left;   KNEE ARTHROSCOPY Right    PILONIDAL CYST / SINUS EXCISION  1998   ROBOTIC ASSISTED TOTAL HYSTERECTOMY  10/19/2011   SHOULDER ARTHROSCOPY Right 2014   WISDOM TOOTH EXTRACTION  2008    Family History  Problem Relation Age of Onset   Asthma Father    Emphysema Father    Cancer Father    Sickle cell trait Mother    Thyroid  disease Mother    Diabetes Mother    Hypertension Mother    Kidney failure Sister    Migraines Sister    Kidney disease Sister    Thyroid  disease Sister     Social History:  reports that she has never smoked. She has never used smokeless tobacco. She reports that she does not drink alcohol and does not use drugs.  Allergies:  Allergies  Allergen Reactions   Contrast Media [Iodinated Contrast Media] Swelling    FACIAL SWELLING   Ibuprofen  Other (See Comments)    STOMACH IRRITATION   Adhesive [  Tape] Other (See Comments)    SKIN IRRITATION   Tramadol Itching    Medications: I have reviewed the patient's current medications.  The PMH, PSH, Medications, Allergies, and SH were reviewed and updated.  ROS: Constitutional: Negative for fever, weight loss and weight gain. Cardiovascular: Negative for chest pain and dyspnea on exertion. Respiratory: Is not experiencing shortness of breath at rest. Gastrointestinal: Negative for nausea and vomiting. Neurological: Negative for headaches. Psychiatric: The patient is not nervous/anxious  Blood pressure 119/74, pulse (!) 57, last menstrual  period 09/15/2011, SpO2 97%. There is no height or weight on file to calculate BMI.  PHYSICAL EXAM:  Exam: General: Well-developed, well-nourished Communication and Voice: Clear pitch and clarity Respiratory Respiratory effort: Equal inspiration and expiration without stridor Cardiovascular Peripheral Vascular: Warm extremities with equal color/perfusion Eyes: No nystagmus with equal extraocular motion bilaterally Neuro/Psych/Balance: Patient oriented to person, place, and time; Appropriate mood and affect; Gait is intact with no imbalance; Cranial nerves I-XII are intact Head and Face Inspection: Normocephalic and atraumatic without mass or lesion Palpation: Facial skeleton intact without bony stepoffs Salivary Glands: No mass or tenderness Facial Strength: Facial motility symmetric and full bilaterally ENT Pinna: External ear intact and fully developed External canal: Canal is patent with intact skin Tympanic Membrane: Clear and mobile External Nose: No scar or anatomic deformity Internal Nose: Septum is deviated to the left. No polyp, or purulence. Mucosal edema and erythema present.  Bilateral inferior turbinate hypertrophy.  Lips, Teeth, and gums: Mucosa and teeth intact and viable TMJ: No pain to palpation with full mobility Oral cavity/oropharynx: No erythema or exudate, no lesions present Nasopharynx: No mass or lesion with intact mucosa Hypopharynx: Intact mucosa without pooling of secretions Larynx Glottic: Full true vocal cord mobility without lesion or mass Supraglottic: Normal appearing epiglottis and AE folds Interarytenoid Space: Moderate pachydermia&edema Subglottic Space: Patent without lesion or edema Neck Neck and Trachea: Midline trachea without mass or lesion Thyroid : No mass or nodularity Lymphatics: No lymphadenopathy  Procedure: Preoperative diagnosis: globus sensation   Postoperative diagnosis:   Same  Procedure: Flexible fiberoptic  laryngoscopy  Surgeon: Elena Larry, MD  Anesthesia: Topical lidocaine  and Afrin Complications: None Condition is stable throughout exam  Indications and consent:  The patient presents to the clinic with above symptoms. Indirect laryngoscopy view was incomplete. Thus it was recommended that they undergo a flexible fiberoptic laryngoscopy. All of the risks, benefits, and potential complications were reviewed with the patient preoperatively and verbal informed consent was obtained.  Procedure: The patient was seated upright in the clinic. Topical lidocaine  and Afrin were applied to the nasal cavity. After adequate anesthesia had occurred, I then proceeded to pass the flexible telescope into the nasal cavity. The nasal cavity was patent without rhinorrhea or polyp. The nasopharynx was also patent without mass or lesion. The base of tongue was visualized and was normal. There were no signs of pooling of secretions in the piriform sinuses. The true vocal folds were mobile bilaterally. There were no signs of glottic or supraglottic mucosal lesion or mass. There was moderate interarytenoid pachydermia and post cricoid edema. The telescope was then slowly withdrawn and the patient tolerated the procedure throughout.   Studies Reviewed: Allergy  test 11/08/19    Assessment/Plan: Encounter Diagnoses  Name Primary?   Globus sensation Yes   Chronic GERD    Chronic nasal congestion    Post-nasal drip    Environmental and seasonal allergies    Hypertrophy of both inferior nasal turbinates  Nasal septal deviation     Assessment and Plan Assessment & Plan Globus sensation  Scope exam overall reassuring with changes c/w GERD LPR and minimal clear post-nasal drainage.  Chronic mucus sensation likely from postnasal drip and laryngopharyngeal reflux irritation.  - medical management of GERD LPR and PND - instructions given  Chronic nasal congestion and post-nasal drainage. Environmental allergies  based on testing  Evidence of post-nasal drainage during flexible scope exam today, could be contributing to her sx - trial of Xyzal  5 mg daily and Flonase  2 puffs b/l nares BID - consider nasal saline rinses   GERD LPR -  Reflux Gourmet after meals - diet and lifestyle changes to minimize GERD - Refer to [employer] blog for dietary and lifestyle modifications/reflux cook book   Thank you for allowing me to participate in the care of this patient. Please do not hesitate to contact me with any questions or concerns.   Elena Larry, MD Otolaryngology [facility] Phone: [phone] Fax: [phone]    11/10/2023, 2:01 PM

## 2023-12-01 ENCOUNTER — Ambulatory Visit: Admitting: Allergy

## 2023-12-01 ENCOUNTER — Encounter: Payer: Self-pay | Admitting: Allergy

## 2023-12-01 ENCOUNTER — Other Ambulatory Visit: Payer: Self-pay

## 2023-12-01 VITALS — BP 134/76 | HR 58 | Temp 98.0°F | Ht 63.39 in | Wt 184.9 lb

## 2023-12-01 DIAGNOSIS — J302 Other seasonal allergic rhinitis: Secondary | ICD-10-CM

## 2023-12-01 DIAGNOSIS — H1013 Acute atopic conjunctivitis, bilateral: Secondary | ICD-10-CM

## 2023-12-01 DIAGNOSIS — T781XXD Other adverse food reactions, not elsewhere classified, subsequent encounter: Secondary | ICD-10-CM | POA: Diagnosis not present

## 2023-12-01 DIAGNOSIS — J3089 Other allergic rhinitis: Secondary | ICD-10-CM

## 2023-12-01 DIAGNOSIS — R490 Dysphonia: Secondary | ICD-10-CM | POA: Diagnosis not present

## 2023-12-01 MED ORDER — RYALTRIS 665-25 MCG/ACT NA SUSP: 2.0000 | Freq: Two times a day (BID) | NASAL | 5 refills | Status: DC

## 2023-12-01 NOTE — Patient Instructions (Addendum)
-   environmental allergy  testing was positive from 2021 to grass pollens, weed pollens, molds, cat, cockroach.  Will plan to update testing to see if you have developed new allergens.  Schedule skin testing visit and hold antihistamines 3 days prior to testing.   - continue Xyzal  (levocetirizine) 5mg  daily at this time  - for hoarseness most likely due to postnasal drip and/or nasal congestion recommend use of combination nasal spray Ryaltris  2 sprays each nostril twice a day.  This has mometasone (steroid) for congestion control and olopatadine (antihistamine) for drainage control.  - for itchy, watery, red eyes use over-the-counter Pataday 1 drop each eye daily as needed.   - if medication management is not effective enough then consider course of allergen immunotherapy.      - the oral allergy  syndrome (OAS) or pollen-food allergy  syndrome (PFAS) is a relatively common form of food allergy , particularly in adults. It typically occurs in people who have pollen allergies when the immune system sees proteins on the food that look like proteins on the pollen. This results in the allergy  antibody (IgE) binding to the food instead of the pollen. Patients typically report itching and/or mild swelling of the mouth and throat immediately following ingestion of certain uncooked fruits (including nuts) or raw vegetables. Only a very small number of affected individuals experience systemic allergic reactions, such as anaphylaxis which occurs with true food allergies.   This is what is happening with foods like strawberries.  See chart below.   Schedule skin testing visit

## 2023-12-01 NOTE — Progress Notes (Unsigned)
 New Patient Note  RE: Margaret Hughes MRN: [id number] DOB: 23-Sep-1960 Date of Office Visit: 12/01/2023  Primary care provider: Rexanne Ingle, MD  Chief Complaint: Allergies  History of present illness: Margaret Hughes is a 63 y.o. female presenting today for evaluation of allergic rhinitis.  She is a former patient of the practice last seen by myself on 11/08/2019 for contact dermatitis. Discussed the use of AI scribe software for clinical note transcription with the patient, who gave verbal consent to proceed.  She experiences persistent mucus in her throat, described as a sensation of 'something in my throat,' ongoing since the spring and summer, particularly when outdoors. No obstructive breathing issues, but the mucus sensation is bothersome and cannot be cleared.  She states it seems that she walks outside she can lose her voice.  She was prescribed levocetirizine and advised to use Flonase  for nasal symptoms. Despite these treatments, symptoms continue. This year, symptoms are worse compared to previous years. She has allergies to grass, pollen, weed, molds, cat, and cockroach, identified in testing conducted a few years ago. She experiences occasional sneezing and itchy, watery eyes, using Pataday eye drops. She also reports oral allergy  syndrome symptoms, particularly with strawberries, managed by soaking them in vinegar before consumption.  She has a history of skin rashes and has an upcoming appointment with a dermatologist. No significant issues with rashes recently.      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    Past surgical history: Past  Surgical History:  Procedure Laterality Date   CARPAL TUNNEL RELEASE Left 01/30/2014   Procedure: CARPAL TUNNEL RELEASE;  Surgeon: Norleen LITTIE Gavel, MD;  Location: [facility];  Service: Orthopedics;  Laterality: Left;   CYSTOSCOPY WITH URETEROSCOPY AND STENT PLACEMENT Bilateral 10/19/2011   DORSAL COMPARTMENT RELEASE Left 01/30/2014   Procedure: RELEASE DORSAL COMPARTMENT (DEQUERVAIN);  Surgeon: Norleen LITTIE Gavel, MD;  Location: [facility];  Service: Orthopedics;  Laterality: Left;   KNEE ARTHROSCOPY Right    PILONIDAL CYST / SINUS EXCISION  1998   ROBOTIC ASSISTED TOTAL HYSTERECTOMY  10/19/2011   SHOULDER ARTHROSCOPY Right 2014   WISDOM TOOTH EXTRACTION  2008    Family history:  Family History  Problem Relation Age of Onset   Asthma Father    Emphysema Father    Cancer Father    Sickle cell trait Mother    Thyroid  disease Mother    Diabetes Mother    Hypertension Mother    Kidney failure Sister    Migraines Sister    Kidney disease Sister    Thyroid  disease Sister     Social history: Lives in a home with carpeting in the bedroom with gas heating and central cooling.  No pets in the home.  There is no concern for water  damage, mildew or roaches in the home.  She is retired.  Denies any smoking history.   Medication List: Current Outpatient Medications  Medication Sig Dispense Refill   estradiol (VIVELLE-DOT) 0.1 MG/24HR patch Place 1 patch onto the skin 2 (two) times a week.     fluticasone  (FLONASE ) 50  MCG/ACT nasal spray Place 2 sprays into both nostrils 2 (two) times daily. 16 g 6   levocetirizine (XYZAL  ALLERGY  24HR) 5 MG tablet Take 1 tablet (5 mg total) by mouth every evening. 30 tablet 3   levothyroxine (SYNTHROID) 112 MCG tablet Take 112 mcg by mouth every morning.     losartan-hydrochlorothiazide (HYZAAR) 50-12.5 MG tablet Take 1 tablet by mouth daily.     Multiple Vitamin (MULTIVITAMIN) tablet Take 1 tablet by mouth daily.     QSYMIA 3.75-23  MG CP24 Take 1 capsule by mouth daily.     estradiol (CLIMARA - DOSED IN MG/24 HR) 0.05 mg/24hr patch Place 0.05 mg onto the skin once a week.     estradiol (ESTRACE) 1 MG tablet Take 1 mg by mouth daily. (Patient not taking: Reported on 11/10/2023)     HYDROcodone -acetaminophen  (NORCO/VICODIN) 5-325 MG per tablet Take 1-2 tablets by mouth every 6 (six) hours as needed for severe pain. (Patient not taking: Reported on 11/10/2023) 40 tablet 0   levothyroxine (SYNTHROID, LEVOTHROID) 125 MCG tablet Take 110 mcg by mouth daily.  (Patient not taking: Reported on 11/10/2023)     triamcinolone cream (KENALOG) 0.1 % Apply topically. (Patient not taking: Reported on 11/10/2023)     Vitamin D, Ergocalciferol, (DRISDOL) 1.25 MG (50000 UNIT) CAPS capsule Take 50,000 Units by mouth once a week. (Patient not taking: Reported on 11/10/2023)     No current facility-administered medications for this visit.    Known medication allergies: Allergies  Allergen Reactions   Contrast Media [Iodinated Contrast Media] Swelling    FACIAL SWELLING   Ibuprofen  Other (See Comments)    STOMACH IRRITATION   Adhesive [Tape] Other (See Comments)    SKIN IRRITATION   Tramadol Itching     Physical examination: Blood pressure 134/76, pulse (!) 58, temperature 98 F (36.7 C), temperature source Temporal, height 5' 3.39 (1.61 m), weight 184 lb 14.4 oz (83.9 kg), last menstrual period 09/15/2011, SpO2 100%.  General: Alert, interactive, in no acute distress. HEENT: PERRLA, TMs pearly gray, turbinates minimally edematous without discharge, post-pharynx non erythematous. Neck: Supple without lymphadenopathy. Lungs: Clear to auscultation without wheezing, rhonchi or rales. {no increased work of breathing. CV: Normal S1, S2 without murmurs. Abdomen: Nondistended, nontender. Skin: Warm and dry, without lesions or rashes. Extremities:  No clubbing, cyanosis or edema. Neuro:   Grossly intact.  Diagnostics/Labs: None  today  Assessment and plan: Hoarseness Allergic rhinitis with conjunctivitis Oral allergy  syndrome    - environmental allergy  testing was positive from 2021 to grass pollens, weed pollens, molds, cat, cockroach.  Will plan to update testing to see if you have developed new allergens.  Schedule skin testing visit and hold antihistamines 3 days prior to testing.   - continue Xyzal  (levocetirizine) 5mg  daily at this time  - for hoarseness most likely due to postnasal drip and/or nasal congestion recommend use of combination nasal spray Ryaltris  2 sprays each nostril twice a day.  This has mometasone (steroid) for congestion control and olopatadine (antihistamine) for drainage control.  - for itchy, watery, red eyes use over-the-counter Pataday 1 drop each eye daily as needed.   - if medication management is not effective enough then consider course of allergen immunotherapy.      - the oral allergy  syndrome (OAS) or pollen-food allergy  syndrome (PFAS) is a relatively common form of food allergy , particularly in adults. It typically occurs in people who have pollen allergies when the immune system sees proteins on the food that  look like proteins on the pollen. This results in the allergy  antibody (IgE) binding to the food instead of the pollen. Patients typically report itching and/or mild swelling of the mouth and throat immediately following ingestion of certain uncooked fruits (including nuts) or raw vegetables. Only a very small number of affected individuals experience systemic allergic reactions, such as anaphylaxis which occurs with true food allergies.   This is what is happening with foods like strawberries.  See chart below.  Schedule skin testing visit (end 1-55)  I appreciate the opportunity to take part in Arlicia's care. Please do not hesitate to contact me with questions.  Sincerely,   Danita Brain, MD Allergy /Immunology [facility]

## 2023-12-05 ENCOUNTER — Other Ambulatory Visit (INDEPENDENT_AMBULATORY_CARE_PROVIDER_SITE_OTHER): Payer: Self-pay | Admitting: Otolaryngology

## 2023-12-14 DIAGNOSIS — M0609 Rheumatoid arthritis without rheumatoid factor, multiple sites: Secondary | ICD-10-CM | POA: Diagnosis not present

## 2023-12-14 DIAGNOSIS — M25531 Pain in right wrist: Secondary | ICD-10-CM | POA: Diagnosis not present

## 2023-12-14 DIAGNOSIS — M2559 Pain in other specified joint: Secondary | ICD-10-CM | POA: Diagnosis not present

## 2023-12-15 ENCOUNTER — Ambulatory Visit: Admitting: Allergy

## 2023-12-15 ENCOUNTER — Encounter: Payer: Self-pay | Admitting: Allergy

## 2023-12-15 DIAGNOSIS — J3089 Other allergic rhinitis: Secondary | ICD-10-CM

## 2023-12-15 DIAGNOSIS — H1013 Acute atopic conjunctivitis, bilateral: Secondary | ICD-10-CM | POA: Diagnosis not present

## 2023-12-15 DIAGNOSIS — T781XXD Other adverse food reactions, not elsewhere classified, subsequent encounter: Secondary | ICD-10-CM

## 2023-12-15 DIAGNOSIS — J302 Other seasonal allergic rhinitis: Secondary | ICD-10-CM | POA: Diagnosis not present

## 2023-12-15 DIAGNOSIS — R490 Dysphonia: Secondary | ICD-10-CM

## 2023-12-15 MED ORDER — RYALTRIS 665-25 MCG/ACT NA SUSP: 2.0000 | Freq: Two times a day (BID) | NASAL | 5 refills | Status: AC

## 2023-12-15 MED ORDER — LEVOCETIRIZINE DIHYDROCHLORIDE 5 MG PO TABS: 5.0000 mg | ORAL_TABLET | Freq: Every evening | ORAL | 5 refills | Status: AC

## 2023-12-15 NOTE — Progress Notes (Signed)
 Follow-up Note  RE: Margaret Hughes MRN: [id number] DOB: [birth year] Date of Office Visit: 12/15/2023   History of present illness: Margaret Hughes is a 63 y.o. female presenting today for skin testing visit.  She was last seen in the office on 12/01/23 for hoarseness of voice, contact dermatitis.  She is in her usual state of health today without recent illness.  She has held antihistamines for at least 3 days for testing today.   Medication List: Current Outpatient Medications  Medication Sig Dispense Refill   estradiol (VIVELLE-DOT) 0.1 MG/24HR patch Place 1 patch onto the skin 2 (two) times a week.     fluticasone  (FLONASE ) 50 MCG/ACT nasal spray Place 2 sprays into both nostrils 2 (two) times daily. 16 g 6   levocetirizine (XYZAL ) 5 MG tablet TAKE 1 TABLET BY MOUTH EVERY DAY IN THE EVENING 90 tablet 2   levothyroxine (SYNTHROID) 112 MCG tablet Take 112 mcg by mouth every morning.     losartan-hydrochlorothiazide (HYZAAR) 50-12.5 MG tablet Take 1 tablet by mouth daily.     Multiple Vitamin (MULTIVITAMIN) tablet Take 1 tablet by mouth daily.     Olopatadine-Mometasone (RYALTRIS ) 665-25 MCG/ACT SUSP Place 2 sprays into the nose in the morning and at bedtime. 29 g 5   QSYMIA 3.75-23 MG CP24 Take 1 capsule by mouth daily.     No current facility-administered medications for this visit.     Known medication allergies: Allergies  Allergen Reactions   Contrast Media [Iodinated Contrast Media] Swelling    FACIAL SWELLING   Ibuprofen  Other (See Comments)    STOMACH IRRITATION   Adhesive [Tape] Other (See Comments)    SKIN IRRITATION   Tramadol Itching   Diagnostics/Labs:  Allergy  testing:   Airborne Adult Perc - 12/15/23 0853     Time Antigen Placed 9146    Allergen Manufacturer Jestine    Location Back    Number of Test 55    1. Control-Buffer 50% Glycerol Negative    2. Control-Histamine 2+    3. Bahia Negative    4. French Southern [place] Negative    5. Johnson Negative    6. [place]   Blue Negative    7. Meadow Fescue Negative    8. Perennial Rye 3+    9. Timothy Negative    10. Ragweed Mix 2+    11. Cocklebur Negative    12. Plantain,  English 2+    13. Baccharis Negative    14. Dog Fennel Negative    15. Russian Thistle Negative    16. Lamb's Quarters Negative    17. Sheep Sorrell Negative    18. Rough Pigweed Negative    19. Marsh Elder, Rough Negative    20. Mugwort, Common Negative    21. Box, Elder Negative    22. Cedar, red Negative    23. Sweet Gum Negative    24. Pecan Pollen Negative    25. Pine Mix Negative    26. Walnut, Black Pollen Negative    27. Red Mulberry Negative    28. Ash Mix Negative    29. Birch Mix Negative    30. Beech American Negative    31. Cottonwood, Guinea-Bissau Negative    32. Hickory, White 3+    33. Maple Mix Negative    34. Oak, [place] Mix Negative    35. Sycamore Eastern Negative    36. Alternaria Alternata Negative    37. Cladosporium Herbarum Negative    38. Aspergillus Mix Negative  39. Penicillium Mix Negative    40. Bipolaris Sorokiniana (Helminthosporium) Negative    41. Drechslera Spicifera (Curvularia) Negative    42. Mucor Plumbeus Negative    43. Fusarium Moniliforme Negative    44. Aureobasidium Pullulans (pullulara) Negative    45. Rhizopus Oryzae Negative    46. Botrytis Cinera Negative    47. Epicoccum Nigrum Negative    48. Phoma Betae Negative    49. Dust Mite Mix Negative    50. Cat Hair 10,000 BAU/ml Negative    51.  Dog Epithelia Negative    52. Mixed Feathers Negative    53. Horse Epithelia Negative    54. Cockroach, German Negative    55. Tobacco Leaf Negative          Allergy  testing results were read and interpreted by provider, documented by clinical staff.   Assessment and plan: Hoarseness Allergic rhinitis with conjunctivitis Oral allergy  syndrome     - environmental allergy  testing today is positive to grass, weed and tree pollens  - environmental allergy  testing was  positive from 2021 to grass pollens, weed pollens, molds, cat, cockroach.    - continue Xyzal  (levocetirizine) 5mg  daily at this time  - for hoarseness most likely due to postnasal drip and/or nasal congestion recommend use of combination nasal spray Ryaltris  2 sprays each nostril twice a day.  This has mometasone (steroid) for congestion control and olopatadine (antihistamine) for drainage control.  - for itchy, watery, red eyes use over-the-counter Pataday 1 drop each eye daily as needed.   - if medication management is not effective enough then consider course of allergen immunotherapy.      - the oral allergy  syndrome (OAS) or pollen-food allergy  syndrome (PFAS) is a relatively common form of food allergy , particularly in adults. It typically occurs in people who have pollen allergies when the immune system sees proteins on the food that look like proteins on the pollen. This results in the allergy  antibody (IgE) binding to the food instead of the pollen. Patients typically report itching and/or mild swelling of the mouth and throat immediately following ingestion of certain uncooked fruits (including nuts) or raw vegetables. Only a very small number of affected individuals experience systemic allergic reactions, such as anaphylaxis which occurs with true food allergies.   This is what is happening with foods like strawberries.  See chart below.  Follow-up in 6 months or sooner if needed  I appreciate the opportunity to take part in Margaret Hughes's care. Please do not hesitate to contact me with questions.  Sincerely,   Danita Brain, MD Allergy /Immunology [facility]

## 2023-12-15 NOTE — Patient Instructions (Addendum)
-   environmental allergy  testing today is positive to grass, weed and tree pollens  - environmental allergy  testing was positive from 2021 to grass pollens, weed pollens, molds, cat, cockroach.    - continue Xyzal  (levocetirizine) 5mg  daily at this time  - for hoarseness most likely due to postnasal drip and/or nasal congestion recommend use of combination nasal spray Ryaltris  2 sprays each nostril twice a day.  This has mometasone (steroid) for congestion control and olopatadine (antihistamine) for drainage control.  - for itchy, watery, red eyes use over-the-counter Pataday 1 drop each eye daily as needed.   - if medication management is not effective enough then consider course of allergen immunotherapy.      - the oral allergy  syndrome (OAS) or pollen-food allergy  syndrome (PFAS) is a relatively common form of food allergy , particularly in adults. It typically occurs in people who have pollen allergies when the immune system sees proteins on the food that look like proteins on the pollen. This results in the allergy  antibody (IgE) binding to the food instead of the pollen. Patients typically report itching and/or mild swelling of the mouth and throat immediately following ingestion of certain uncooked fruits (including nuts) or raw vegetables. Only a very small number of affected individuals experience systemic allergic reactions, such as anaphylaxis which occurs with true food allergies.   This is what is happening with foods like strawberries.  See chart below.    Follow-up in 6 months or sooner if needed

## 2024-01-02 DIAGNOSIS — Z1231 Encounter for screening mammogram for malignant neoplasm of breast: Secondary | ICD-10-CM | POA: Diagnosis not present

## 2024-02-01 ENCOUNTER — Other Ambulatory Visit (INDEPENDENT_AMBULATORY_CARE_PROVIDER_SITE_OTHER): Payer: Self-pay | Admitting: Otolaryngology

## 2024-02-13 DIAGNOSIS — M0609 Rheumatoid arthritis without rheumatoid factor, multiple sites: Secondary | ICD-10-CM | POA: Diagnosis not present

## 2024-02-13 DIAGNOSIS — M25531 Pain in right wrist: Secondary | ICD-10-CM | POA: Diagnosis not present

## 2024-02-13 DIAGNOSIS — M2559 Pain in other specified joint: Secondary | ICD-10-CM | POA: Diagnosis not present

## 2024-04-04 DIAGNOSIS — R52 Pain, unspecified: Secondary | ICD-10-CM | POA: Diagnosis not present

## 2024-04-04 DIAGNOSIS — R5383 Other fatigue: Secondary | ICD-10-CM | POA: Diagnosis not present

## 2024-04-04 DIAGNOSIS — R509 Fever, unspecified: Secondary | ICD-10-CM | POA: Diagnosis not present

## 2024-04-04 DIAGNOSIS — Z03818 Encounter for observation for suspected exposure to other biological agents ruled out: Secondary | ICD-10-CM | POA: Diagnosis not present

## 2024-06-14 ENCOUNTER — Ambulatory Visit: Admitting: Allergy

## 2024-08-29 ENCOUNTER — Ambulatory Visit: Admitting: Dermatology
